# Patient Record
Sex: Female | Born: 1960 | Race: Black or African American | Hispanic: No | Marital: Married | Smoking: Former smoker
Health system: Southern US, Community
[De-identification: ages and names within clinical notes are randomized; demographics above are authoritative.]

## PROBLEM LIST (undated history)

## (undated) DIAGNOSIS — I10 Essential (primary) hypertension: Secondary | ICD-10-CM

## (undated) DIAGNOSIS — F32A Depression, unspecified: Secondary | ICD-10-CM

## (undated) DIAGNOSIS — E119 Type 2 diabetes mellitus without complications: Secondary | ICD-10-CM

## (undated) DIAGNOSIS — F329 Major depressive disorder, single episode, unspecified: Secondary | ICD-10-CM

## (undated) DIAGNOSIS — E785 Hyperlipidemia, unspecified: Secondary | ICD-10-CM

## (undated) DIAGNOSIS — D649 Anemia, unspecified: Secondary | ICD-10-CM

## (undated) DIAGNOSIS — H269 Unspecified cataract: Secondary | ICD-10-CM

## (undated) DIAGNOSIS — F419 Anxiety disorder, unspecified: Secondary | ICD-10-CM

## (undated) HISTORY — PX: TUBAL LIGATION: SHX77

## (undated) HISTORY — DX: Unspecified cataract: H26.9

## (undated) HISTORY — DX: Type 2 diabetes mellitus without complications: E11.9

## (undated) HISTORY — DX: Essential (primary) hypertension: I10

## (undated) HISTORY — DX: Anxiety disorder, unspecified: F41.9

## (undated) HISTORY — DX: Hyperlipidemia, unspecified: E78.5

## (undated) HISTORY — DX: Anemia, unspecified: D64.9

## (undated) HISTORY — DX: Depression, unspecified: F32.A

## (undated) HISTORY — PX: ABDOMINAL HYSTERECTOMY: SHX81

---
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
## 1982-07-16 HISTORY — PX: TUBAL LIGATION: SHX77

## 2008-07-16 HISTORY — PX: ABDOMINAL HYSTERECTOMY: SHX81

## 2013-07-16 HISTORY — PX: COLONOSCOPY: SHX174

## 2013-07-16 HISTORY — PX: POLYPECTOMY: SHX149

## 2013-08-12 LAB — HM COLONOSCOPY

## 2013-10-20 LAB — LIPID PANEL
Cholesterol: 279 mg/dL — AB (ref 0–200)
HDL: 73 mg/dL — AB (ref 35–70)
LDL Cholesterol: 185 mg/dL
TRIGLYCERIDES: 104 mg/dL (ref 40–160)

## 2013-10-20 LAB — HEMOGLOBIN A1C: Hgb A1c MFr Bld: 6.1 % — AB (ref 4.0–6.0)

## 2014-02-24 ENCOUNTER — Encounter: Payer: Self-pay | Admitting: Physician Assistant

## 2014-02-24 ENCOUNTER — Ambulatory Visit (INDEPENDENT_AMBULATORY_CARE_PROVIDER_SITE_OTHER): Payer: 59 | Admitting: Physician Assistant

## 2014-02-24 VITALS — BP 135/85 | HR 95 | Ht 62.75 in | Wt 180.0 lb

## 2014-02-24 DIAGNOSIS — E785 Hyperlipidemia, unspecified: Secondary | ICD-10-CM

## 2014-02-24 DIAGNOSIS — Z720 Tobacco use: Secondary | ICD-10-CM

## 2014-02-24 DIAGNOSIS — Z860100 Personal history of colon polyps, unspecified: Secondary | ICD-10-CM

## 2014-02-24 DIAGNOSIS — F17200 Nicotine dependence, unspecified, uncomplicated: Secondary | ICD-10-CM

## 2014-02-24 DIAGNOSIS — F17290 Nicotine dependence, other tobacco product, uncomplicated: Secondary | ICD-10-CM

## 2014-02-24 DIAGNOSIS — Z1239 Encounter for other screening for malignant neoplasm of breast: Secondary | ICD-10-CM

## 2014-02-24 MED ORDER — BUPROPION HCL ER (SMOKING DET) 150 MG PO TB12: 150.0000 mg | ORAL_TABLET | Freq: Two times a day (BID) | ORAL | Status: DC

## 2014-02-24 NOTE — Progress Notes (Addendum)
   Subjective:    Patient ID: Christina King, female    DOB: [birth year], 53 y.o.   MRN: [id number]  HPI Pt is a 53 yo female who presents to the clinic to establish care.   .. Past Medical History  Diagnosis Date  . Hyperlipidemia    She was started on zocor 3 months ago. She has lost 10lbs since then. She is working on low fat diet.   . Family History  Problem Relation Age of Onset  . Diabetes Mother   . Hypertension Mother   . Diabetes Father   . Hyperlipidemia Father   . Hypertension Father   . Diabetes Sister   . Depression Brother   . Cancer Paternal Aunt     esophageal   .. History   Social History  . Marital Status: Married    Spouse Name: N/A    Number of Children: N/A  . Years of Education: N/A   Occupational History  . Not on file.   Social History Main Topics  . Smoking status: Current Every Day Smoker  . Smokeless tobacco: Not on file  . Alcohol Use: Yes  . Drug Use: No  . Sexual Activity: No   Other Topics Concern  . Not on file   Social History Narrative  . No narrative on file    Pt would like to quit smoking. She has tried zyban and quit smoking in one month about 10 years ago. She stayed quit for 5 years but started back when her husband irritated her one night. She has a grand baby coming in march and she must quit.    Review of Systems  All other systems reviewed and are negative.      Objective:   Physical Exam  Constitutional: She is oriented to person, place, and time. She appears well-developed and well-nourished.  HENT:  Head: Normocephalic and atraumatic.  Neck: Normal range of motion. Neck supple.  Cardiovascular: Normal rate, regular rhythm and normal heart sounds.   Pulmonary/Chest: Effort normal and breath sounds normal.  Lymphadenopathy:    She has no cervical adenopathy.  Neurological: She is alert and oriented to person, place, and time.  Psychiatric: She has a normal mood and affect. Her behavior is normal.           Assessment & Plan:  Hyperlipidemia- follow up in 2-3 months for lipid recheck. Continue low fat diet in combination with zocor.   nictotine dependence/tobacco abuse- pt ready to quit. Tolerated zyban before. Started today with one refill. Tobacco cessation and cutting back discussed. Pt is very motivated. Follow up in 2 months. Warned of side effects. Call with any worsening anxiety or depression. Discussed staying on anti-depressant after stopping smoking to avoid relapse and help deal with anxiety. Pt unsure of what she would like to do.   Will order screening mammogram.  Continues to see GYN for annual exam.

## 2014-02-24 NOTE — Addendum Note (Signed)
Addended by: Donella Stade on: 02/24/2014 11:35 AM   Modules accepted: Orders

## 2014-02-25 ENCOUNTER — Other Ambulatory Visit: Payer: Self-pay | Admitting: *Deleted

## 2014-02-25 MED ORDER — BUPROPION HCL ER (SMOKING DET) 150 MG PO TB12: 150.0000 mg | ORAL_TABLET | Freq: Two times a day (BID) | ORAL | Status: DC

## 2014-03-16 ENCOUNTER — Encounter: Payer: Self-pay | Admitting: Physician Assistant

## 2014-03-16 DIAGNOSIS — E669 Obesity, unspecified: Secondary | ICD-10-CM | POA: Insufficient documentation

## 2014-03-16 DIAGNOSIS — F4322 Adjustment disorder with anxiety: Secondary | ICD-10-CM | POA: Insufficient documentation

## 2014-03-18 ENCOUNTER — Ambulatory Visit (INDEPENDENT_AMBULATORY_CARE_PROVIDER_SITE_OTHER): Payer: 59

## 2014-03-18 DIAGNOSIS — Z1231 Encounter for screening mammogram for malignant neoplasm of breast: Secondary | ICD-10-CM

## 2014-03-29 ENCOUNTER — Encounter: Payer: Self-pay | Admitting: Physician Assistant

## 2014-05-03 ENCOUNTER — Ambulatory Visit (INDEPENDENT_AMBULATORY_CARE_PROVIDER_SITE_OTHER): Payer: 59 | Admitting: Physician Assistant

## 2014-05-03 ENCOUNTER — Encounter: Payer: Self-pay | Admitting: Physician Assistant

## 2014-05-03 VITALS — BP 146/95 | HR 83 | Ht 62.75 in | Wt 182.0 lb

## 2014-05-03 DIAGNOSIS — F4322 Adjustment disorder with anxiety: Secondary | ICD-10-CM

## 2014-05-03 DIAGNOSIS — Z23 Encounter for immunization: Secondary | ICD-10-CM

## 2014-05-03 DIAGNOSIS — E785 Hyperlipidemia, unspecified: Secondary | ICD-10-CM

## 2014-05-03 DIAGNOSIS — Z131 Encounter for screening for diabetes mellitus: Secondary | ICD-10-CM

## 2014-05-03 DIAGNOSIS — R7301 Impaired fasting glucose: Secondary | ICD-10-CM

## 2014-05-03 DIAGNOSIS — Z720 Tobacco use: Secondary | ICD-10-CM

## 2014-05-03 LAB — COMPLETE METABOLIC PANEL WITH GFR
ALBUMIN: 4.7 g/dL (ref 3.5–5.2)
ALT: 47 U/L — AB (ref 0–35)
AST: 40 U/L — ABNORMAL HIGH (ref 0–37)
Alkaline Phosphatase: 109 U/L (ref 39–117)
BILIRUBIN TOTAL: 0.3 mg/dL (ref 0.2–1.2)
BUN: 10 mg/dL (ref 6–23)
CHLORIDE: 106 meq/L (ref 96–112)
CO2: 26 meq/L (ref 19–32)
Calcium: 10 mg/dL (ref 8.4–10.5)
Creat: 0.8 mg/dL (ref 0.50–1.10)
GFR, EST NON AFRICAN AMERICAN: 84 mL/min
GLUCOSE: 104 mg/dL — AB (ref 70–99)
POTASSIUM: 4.8 meq/L (ref 3.5–5.3)
Sodium: 141 mEq/L (ref 135–145)
TOTAL PROTEIN: 7.2 g/dL (ref 6.0–8.3)

## 2014-05-03 LAB — LIPID PANEL
Cholesterol: 240 mg/dL — ABNORMAL HIGH (ref 0–200)
HDL: 76 mg/dL (ref >39)
LDL CALC: 145 mg/dL — AB (ref 0–99)
Total CHOL/HDL Ratio: 3.2 Ratio
Triglycerides: 96 mg/dL (ref <150)
VLDL: 19 mg/dL (ref 0–40)

## 2014-05-03 MED ORDER — VARENICLINE TARTRATE 0.5 MG X 11 & 1 MG X 42 PO MISC: ORAL | Status: DC

## 2014-05-03 MED ORDER — BUPROPION HCL ER (XL) 150 MG PO TB24: 150.0000 mg | ORAL_TABLET | ORAL | Status: DC

## 2014-05-03 NOTE — Progress Notes (Signed)
   Subjective:    Patient ID: Christina King, female    DOB: [birth year], 53 y.o.   MRN: [id number]  HPI Pt presents to the clinic for 2 month follow up.   She has been on zyban to help her stop smoking. She has cut back but continues to smoke. She is at 8 cigs a day. Does feel much better on zyban with her mood and anxiety. Would like to stay on this as well as try something to help her quit smoking. She has a [place] coming and needs to stop smoking.   On zocor for hyperlipidemia. Not checked lipid since started. Tolerating zocor well.     Review of Systems  All other systems reviewed and are negative.      Objective:   Physical Exam  Constitutional: She is oriented to person, place, and time. She appears well-developed and well-nourished.  HENT:  Head: Normocephalic and atraumatic.  Cardiovascular: Normal rate, regular rhythm and normal heart sounds.   Pulmonary/Chest: Effort normal and breath sounds normal.  Neurological: She is alert and oriented to person, place, and time.  Skin: Skin is dry.  Psychiatric: She has a normal mood and affect. Her behavior is normal.          Assessment & Plan:  Tobacco abuse- stop zyban. Start chantix. Coupon card given. Discussed side effects and possible bad dreams. Stop medication if not able to tolerate. Set quit day 7 days after starting chantix. Follow up in one month.   Anxiety- pt felt better on zyban with her mood. She would like to stay on something like this. Given extended release wellbutrin 150mg  to try. Follow up to see if helping in 4 weeks.   Hyperlipidemia- will recheck lipid and cmp. Will refill zocor accordingly.   Tdap given without complication.

## 2014-05-06 NOTE — Addendum Note (Signed)
Addended by: Jamesetta So on: 05/06/2014 08:29 AM   Modules accepted: Orders

## 2014-05-10 ENCOUNTER — Encounter: Payer: Self-pay | Admitting: Physician Assistant

## 2014-05-11 ENCOUNTER — Other Ambulatory Visit: Payer: Self-pay | Admitting: Physician Assistant

## 2014-05-11 MED ORDER — SIMVASTATIN 40 MG PO TABS: 40.0000 mg | ORAL_TABLET | Freq: Every day | ORAL | Status: DC

## 2014-05-28 ENCOUNTER — Telehealth: Payer: Self-pay | Admitting: Physician Assistant

## 2014-05-31 ENCOUNTER — Ambulatory Visit: Payer: 59 | Admitting: Physician Assistant

## 2014-06-02 ENCOUNTER — Telehealth: Payer: Self-pay | Admitting: *Deleted

## 2014-06-02 DIAGNOSIS — R748 Abnormal levels of other serum enzymes: Secondary | ICD-10-CM

## 2014-06-02 NOTE — Telephone Encounter (Signed)
Labs ordered to recheck liver enzymes. 

## 2014-06-14 ENCOUNTER — Ambulatory Visit (INDEPENDENT_AMBULATORY_CARE_PROVIDER_SITE_OTHER): Payer: 59 | Admitting: Physician Assistant

## 2014-06-14 ENCOUNTER — Encounter: Payer: Self-pay | Admitting: Physician Assistant

## 2014-06-14 VITALS — BP 144/75 | HR 88 | Ht 62.75 in | Wt 182.0 lb

## 2014-06-14 DIAGNOSIS — Z720 Tobacco use: Secondary | ICD-10-CM

## 2014-06-14 MED ORDER — VARENICLINE TARTRATE 1 MG PO TABS: 1.0000 mg | ORAL_TABLET | Freq: Two times a day (BID) | ORAL | Status: DC

## 2014-06-15 NOTE — Progress Notes (Signed)
   Subjective:    Patient ID: Christina King, female    DOB: 23-Jul-1960, 53 y.o.   MRN: [id number]  HPI  Pt to the clinic to follow up on smoking cessation. One month of chantix down to 2 cigarettes a day from 1/2 pack a day. She is having some vivid dreams but they are not concerning at this point.     Review of Systems  All other systems reviewed and are negative.      Objective:   Physical Exam  Constitutional: She is oriented to person, place, and time. She appears well-developed and well-nourished.  HENT:  Head: Normocephalic and atraumatic.  Cardiovascular: Normal rate, regular rhythm and normal heart sounds.   Pulmonary/Chest: Effort normal and breath sounds normal. She has no wheezes.  Neurological: She is alert and oriented to person, place, and time.  Skin: Skin is dry.  Psychiatric: She has a normal mood and affect. Her behavior is normal.          Assessment & Plan:  Tobacco abuse/smoking cessation- refilled chantix for 2 months. Discussed in next 7 days to stop all smoking. Pt agrees. Follow up in 2 months. Call with any concerns.

## 2014-08-09 ENCOUNTER — Ambulatory Visit: Payer: 59 | Admitting: Physician Assistant

## 2014-09-24 NOTE — Telephone Encounter (Signed)
This encounter was opened by accident

## 2015-01-21 LAB — HM DIABETES EYE EXAM

## 2015-06-15 ENCOUNTER — Ambulatory Visit (INDEPENDENT_AMBULATORY_CARE_PROVIDER_SITE_OTHER): Payer: 59 | Admitting: Physician Assistant

## 2015-06-15 ENCOUNTER — Encounter: Payer: Self-pay | Admitting: Physician Assistant

## 2015-06-15 VITALS — BP 161/78 | HR 77 | Ht 62.75 in | Wt 216.0 lb

## 2015-06-15 DIAGNOSIS — E119 Type 2 diabetes mellitus without complications: Secondary | ICD-10-CM | POA: Diagnosis not present

## 2015-06-15 DIAGNOSIS — R631 Polydipsia: Secondary | ICD-10-CM | POA: Diagnosis not present

## 2015-06-15 DIAGNOSIS — G609 Hereditary and idiopathic neuropathy, unspecified: Secondary | ICD-10-CM | POA: Diagnosis not present

## 2015-06-15 DIAGNOSIS — H43393 Other vitreous opacities, bilateral: Secondary | ICD-10-CM | POA: Insufficient documentation

## 2015-06-15 LAB — POCT GLYCOSYLATED HEMOGLOBIN (HGB A1C): HEMOGLOBIN A1C: 6.5

## 2015-06-15 MED ORDER — METFORMIN HCL 500 MG PO TABS: 500.0000 mg | ORAL_TABLET | Freq: Two times a day (BID) | ORAL | Status: DC

## 2015-06-15 NOTE — Progress Notes (Signed)
   Subjective:    Patient ID: Christina King, female    DOB: 08-20-1960, 54 y.o.   MRN: [id number]  HPI  Patient is a 54 year old female who presents to the clinic with 2 months of numbness and tingling in both hands and both feet. She experiences the sensation constantly. Seems to be a little worse when she has been active or doing a lot. Nothing seems to help. She has a history of carpal tunnel but this is felt different. She is concerned because she also has had increased thirst and some floaters in her out. She has been to the ophthalmologist and they told her they were vitreous floaters. She denies any problem completing tasks with her hands or walking. She does not have diabetes at this time. She is having increased thirst. She is concerned that something bigger is going on here. She denies any neck or back pain.  Review of Systems  All other systems reviewed and are negative.      Objective:   Physical Exam  Constitutional: She is oriented to person, place, and time. She appears well-developed and well-nourished.  HENT:  Head: Normocephalic and atraumatic.  Cardiovascular: Normal rate, regular rhythm and normal heart sounds.   Pulmonary/Chest: Effort normal and breath sounds normal.  Musculoskeletal:  Strength of bilateral upper and lower extremities 5/5.  Hand grip bilaterally 5/5.  Pedal pulse 2+ and symmetric.  Good sensation in bilaterally feet.   Neurological: She is alert and oriented to person, place, and time. She has normal reflexes. No cranial nerve deficit. Coordination normal.  Skin: Skin is dry.  Psychiatric: She has a normal mood and affect. Her behavior is normal.          Assessment & Plan:  DM/excessive thirst- .[name] Lab Results  Component Value Date   HGBA1C 6.5 06/15/2015   Just makes the diagnosis of DM with a1c of 6.5.  Started metformin. Discussed side effects.  Encouraged to start diabetic diet. Gave HO.  Made nutrition appt.   Vitreous  floaters- reassured pt that she has been evaluated by opthlamology and no signs of diabetic eye changes. Floaters can be normal occurrence with age.   Peripheral neuropathy- unclear etiology. Will check for any metabolic causes such as [id number]. I do not think elevated glucose is bad enough to do this. Since numbness in every extremity back etiology is less likely. Will start with blood work and move from there.

## 2015-06-16 DIAGNOSIS — E559 Vitamin D deficiency, unspecified: Secondary | ICD-10-CM | POA: Insufficient documentation

## 2015-06-16 LAB — COMPLETE METABOLIC PANEL WITH GFR
ALT: 28 U/L (ref 6–29)
AST: 24 U/L (ref 10–35)
Albumin: 3.9 g/dL (ref 3.6–5.1)
Alkaline Phosphatase: 72 U/L (ref 33–130)
BUN: 12 mg/dL (ref 7–25)
CHLORIDE: 106 mmol/L (ref 98–110)
CO2: 24 mmol/L (ref 20–31)
Calcium: 9.4 mg/dL (ref 8.6–10.4)
Creat: 0.71 mg/dL (ref 0.50–1.05)
GFR, Est African American: >89 mL/min (ref >=60)
GFR, Est Non African American: >89 mL/min (ref >=60)
Glucose, Bld: 97 mg/dL (ref 65–99)
POTASSIUM: 3.8 mmol/L (ref 3.5–5.3)
Sodium: 141 mmol/L (ref 135–146)
Total Bilirubin: 0.4 mg/dL (ref 0.2–1.2)
Total Protein: 6.6 g/dL (ref 6.1–8.1)

## 2015-06-16 LAB — CBC WITH DIFFERENTIAL/PLATELET
BASOS ABS: 0 10*3/uL (ref 0.0–0.1)
Basophils Relative: 1 % (ref 0–1)
EOS PCT: 3 % (ref 0–5)
Eosinophils Absolute: 0.1 10*3/uL (ref 0.0–0.7)
HCT: 41.1 % (ref 36.0–46.0)
Hemoglobin: 13.6 g/dL (ref 12.0–15.0)
Lymphocytes Relative: 41 % (ref 12–46)
Lymphs Abs: 1.9 10*3/uL (ref 0.7–4.0)
MCH: 30.3 pg (ref 26.0–34.0)
MCHC: 33.1 g/dL (ref 30.0–36.0)
MCV: 91.5 fL (ref 78.0–100.0)
MPV: 10.2 fL (ref 8.6–12.4)
Monocytes Absolute: 0.5 10*3/uL (ref 0.1–1.0)
Monocytes Relative: 11 % (ref 3–12)
NEUTROS ABS: 2 10*3/uL (ref 1.7–7.7)
Neutrophils Relative %: 44 % (ref 43–77)
PLATELETS: 246 10*3/uL (ref 150–400)
RBC: 4.49 MIL/uL (ref 3.87–5.11)
RDW: 14.4 % (ref 11.5–15.5)
WBC: 4.6 10*3/uL (ref 4.0–10.5)

## 2015-06-16 LAB — VITAMIN D 25 HYDROXY (VIT D DEFICIENCY, FRACTURES): Vit D, 25-Hydroxy: 8 ng/mL — ABNORMAL LOW (ref 30–100)

## 2015-06-16 LAB — TSH: TSH: 1.117 u[IU]/mL (ref 0.350–4.500)

## 2015-06-16 LAB — SEDIMENTATION RATE: Sed Rate: 4 mm/hr (ref 0–30)

## 2015-06-16 LAB — FOLATE: Folate: 8.2 ng/mL

## 2015-06-16 LAB — VITAMIN B12: VITAMIN B 12: 700 pg/mL (ref 211–911)

## 2015-06-16 LAB — FERRITIN: Ferritin: 87 ng/mL (ref 10–291)

## 2015-06-16 MED ORDER — VITAMIN D (ERGOCALCIFEROL) 1.25 MG (50000 UNIT) PO CAPS: 50000.0000 [IU] | ORAL_CAPSULE | ORAL | Status: DC

## 2015-06-17 ENCOUNTER — Encounter: Payer: Self-pay | Admitting: Physician Assistant

## 2015-06-17 ENCOUNTER — Ambulatory Visit (INDEPENDENT_AMBULATORY_CARE_PROVIDER_SITE_OTHER): Payer: 59 | Admitting: Physician Assistant

## 2015-06-17 VITALS — BP 148/75 | HR 69 | Ht 62.75 in | Wt 214.0 lb

## 2015-06-17 DIAGNOSIS — Z114 Encounter for screening for human immunodeficiency virus [HIV]: Secondary | ICD-10-CM

## 2015-06-17 DIAGNOSIS — Z1159 Encounter for screening for other viral diseases: Secondary | ICD-10-CM

## 2015-06-17 DIAGNOSIS — E119 Type 2 diabetes mellitus without complications: Secondary | ICD-10-CM | POA: Insufficient documentation

## 2015-06-17 DIAGNOSIS — Z0000 Encounter for general adult medical examination without abnormal findings: Secondary | ICD-10-CM

## 2015-06-17 DIAGNOSIS — Z23 Encounter for immunization: Secondary | ICD-10-CM | POA: Diagnosis not present

## 2015-06-17 DIAGNOSIS — E785 Hyperlipidemia, unspecified: Secondary | ICD-10-CM

## 2015-06-17 DIAGNOSIS — G609 Hereditary and idiopathic neuropathy, unspecified: Secondary | ICD-10-CM | POA: Insufficient documentation

## 2015-06-17 DIAGNOSIS — L639 Alopecia areata, unspecified: Secondary | ICD-10-CM

## 2015-06-17 DIAGNOSIS — L659 Nonscarring hair loss, unspecified: Secondary | ICD-10-CM

## 2015-06-17 LAB — POCT UA - MICROALBUMIN
CREATININE, POC: 200 mg/dL
Microalbumin Ur, POC: 30 mg/L

## 2015-06-17 LAB — LIPID PANEL
CHOL/HDL RATIO: 3.2 ratio (ref <=5.0)
CHOLESTEROL: 239 mg/dL — AB (ref 125–200)
HDL: 75 mg/dL (ref >=46)
LDL Cholesterol: 145 mg/dL — ABNORMAL HIGH (ref <130)
TRIGLYCERIDES: 96 mg/dL (ref <150)
VLDL: 19 mg/dL (ref <30)

## 2015-06-17 MED ORDER — CLOBETASOL PROPIONATE 0.05 % EX CREA: 1.0000 "application " | TOPICAL_CREAM | Freq: Two times a day (BID) | CUTANEOUS | Status: DC

## 2015-06-17 MED ORDER — MINOXIDIL 5 % EX FOAM: CUTANEOUS | Status: DC

## 2015-06-17 NOTE — Progress Notes (Signed)
Subjective:    Patient ID: Christina King, female    DOB: 1961-06-28, 54 y.o.   MRN: [id number]  HPI   Review of Systems     Objective:   Physical Exam        Assessment & Plan:   Subjective:     Christina King is a 54 y.o. female and is here for a comprehensive physical exam. The patient reports problems - pt would like to address hair loss. seems to be coming out more at the top. no patches. tried nothing. .  Social History   Social History  . Marital Status: Married    Spouse Name: N/A  . Number of Children: N/A  . Years of Education: N/A   Occupational History  . Not on file.   Social History Main Topics  . Smoking status: Former Smoker    Types: Cigarettes    Quit date: 06/20/2014  . Smokeless tobacco: Not on file  . Alcohol Use: 0.0 oz/week    0 Standard drinks or equivalent per week  . Drug Use: No  . Sexual Activity: No   Other Topics Concern  . Not on file   Social History Narrative   Health Maintenance  Topic Date Due  . Hepatitis C Screening  11/11/1959  . PNEUMOCOCCAL POLYSACCHARIDE VACCINE (1) 12/06/1961  . FOOT EXAM  12/06/1969  . OPHTHALMOLOGY EXAM  12/06/1969  . URINE MICROALBUMIN  12/06/1969  . HIV Screening  12/07/1974  . INFLUENZA VACCINE  06/16/2016 (Originally 02/14/2015)  . PAP SMEAR  06/16/2045 (Originally 12/06/1980)  . HEMOGLOBIN A1C  12/13/2015  . MAMMOGRAM  03/18/2016  . COLONOSCOPY  07/16/2018  . TETANUS/TDAP  05/03/2024    The following portions of the patient's history were reviewed and updated as appropriate: allergies, current medications, past family history, past medical history, past social history, past surgical history and problem list.  Review of Systems Pertinent items noted in HPI and remainder of comprehensive ROS otherwise negative.   Objective:    BP 148/75 mmHg  Pulse 69  Ht 5' 2.75" (1.594 m)  Wt 214 lb (97.07 kg)  BMI 38.20 kg/m2 General appearance: alert, cooperative and appears stated  age Head: Normocephalic, without obvious abnormality, atraumatic Eyes: conjunctivae/corneas clear. PERRL, EOM's intact. Fundi benign. Ears: normal TM's and external ear canals both ears Nose: Nares normal. Septum midline. Mucosa normal. No drainage or sinus tenderness. Throat: lips, mucosa, and tongue normal; teeth and gums normal Neck: no adenopathy, no carotid bruit, no JVD, supple, symmetrical, trachea midline and thyroid not enlarged, symmetric, no tenderness/mass/nodules Back: symmetric, no curvature. ROM normal. No CVA tenderness. Lungs: clear to auscultation bilaterally Heart: regular rate and rhythm, S1, S2 normal, no murmur, click, rub or gallop Abdomen: soft, non-tender; bowel sounds normal; no masses,  no organomegaly Extremities: extremities normal, atraumatic, no cyanosis or edema Pulses: 2+ and symmetric Skin: Skin color, texture, turgor normal. No rashes or lesionsdiffuse hair loss around the top of head, no patches.  Lymph nodes: Cervical, supraclavicular, and axillary nodes normal. Neurologic: Grossly normal    Assessment:    Healthy female exam.      Plan:    CPE- fasting labs were ordered. Screening for hep C and HIV. Patient is up-to-date on her vaccines as of today. She declined the flu shot. Encouraged vitamin D 800 units and calcium 1500 mg a day. Encourage regular exercise and healthy diet. Mammogram up-to-date. Colonoscopy up-to-date.  DM- recent diagnosis of diabetes. normal micro. Pneumonia vaccine given today.  Normal foot exam. Patient reports recent eye exam will call to get.  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 Started clobetasol cream with minoxidil foam. Will refer to dermatology for further evaluation and treatment.  See After Visit Summary for Counseling Recommendations

## 2015-06-17 NOTE — Patient Instructions (Addendum)
Keeping You Healthy  Get These Tests  Blood Pressure- Have your blood pressure checked by your healthcare provider at least once a year.  Normal blood pressure is 120/80.  Weight- Have your body mass index (BMI) calculated to screen for obesity.  BMI is a measure of body fat based on height and weight.  You can calculate your own BMI at www.nhlbisupport.com/bmi/  Cholesterol- Have your cholesterol checked every year.  Diabetes- Have your blood sugar checked every year if you have high blood pressure, high cholesterol, a family history of diabetes or if you are overweight.  Pap Test - Have a pap test every 1 to 5 years if you have been sexually active.  If you are older than 65 and recent pap tests have been normal you may not need additional pap tests.  In addition, if you have had a hysterectomy  for benign disease additional pap tests are not necessary.  Mammogram-Yearly mammograms are essential for early detection of breast cancer  Screening for Colon Cancer- Colonoscopy starting at age 50. Screening may begin sooner depending on your family history and other health conditions.  Follow up colonoscopy as directed by your Gastroenterologist.  Screening for Osteoporosis- Screening begins at age 65 with bone density scanning, sooner if you are at higher risk for developing Osteoporosis.  Get these medicines  Calcium with Vitamin D- Your body requires 1200-1500 mg of Calcium a day and 800-1000 IU of Vitamin D a day.  You can only absorb 500 mg of Calcium at a time therefore Calcium must be taken in 2 or 3 separate doses throughout the day.  Hormones- Hormone therapy has been associated with increased risk for certain cancers and heart disease.  Talk to your healthcare provider about if you need relief from menopausal symptoms.  Aspirin- Ask your healthcare provider about taking Aspirin to prevent Heart Disease and Stroke.  Get these Immuniztions  Flu shot- Every fall  Pneumonia shot-  Once after the age of 65; if you are younger ask your healthcare provider if you need a pneumonia shot.  Tetanus- Every ten years.  Zostavax- Once after the age of 60 to prevent shingles.  Take these steps  Don't smoke- Your healthcare provider can help you quit. For tips on how to quit, ask your healthcare provider or go to www.smokefree.gov or call 1-800 QUIT-NOW.  Be physically active- Exercise 5 days a week for a minimum of 30 minutes.  If you are not already physically active, start slow and gradually work up to 30 minutes of moderate physical activity.  Try walking, dancing, bike riding, swimming, etc.  Eat a healthy diet- Eat a variety of healthy foods such as fruits, vegetables, whole grains, low fat milk, low fat cheeses, yogurt, lean meats, chicken, fish, eggs, dried beans, tofu, etc.  For more information go to www.thenutritionsource.org  Dental visit- Brush and floss teeth twice daily; visit your dentist twice a year.  Eye exam- Visit your Optometrist or Ophthalmologist yearly.  Drink alcohol in moderation- Limit alcohol intake to one drink or less a day.  Never drink and drive.  Depression- Your emotional health is as important as your physical health.  If you're feeling down or losing interest in things you normally enjoy, please talk to your healthcare provider.  Seat Belts- can save your life; always wear one  Smoke/Carbon Monoxide detectors- These detectors need to be installed on the appropriate level of your home.  Replace batteries at least once a year.  Violence- If   anyone is threatening or hurting you, please tell your healthcare provider.  Living Will/ Health care power of attorney- Discuss with your healthcare provider and family.  Alopecia Areata Alopecia areata is a type of hair loss. If you have this condition, you may lose hair on your scalp in patches. In some cases, you may lose all the hair on your scalp (alopecia totalis) or all the hair from your face  and body (alopecia universalis).  Alopecia areata is an autoimmune disease. This means your body's defense system (immune system) mistakes normal parts of your body for germs or other things that can make you sick. When you have alopecia areata, your immune system attacks your hair follicles.  Alopecia areata often starts during childhood but can occur at any age. Alopecia areata is not a danger to your health but can be stressful.  CAUSES  The cause of alopecia areata is unknown.  RISK FACTORS You may be at higher risk of alopecia areata if you:   Have a family history of alopecia.  Have a family history of another autoimmune disease, including type 1 diabetes and rheumatoid arthritis. SIGNS AND SYMPTOMS Signs of alopecia areata may include:  Loss of scalp hair in small, round patches. These may be about the size of a quarter.  Loss of all hair on your scalp.  Loss of eyebrow hair, facial hair, or the hair inside your nose (nasal hair).  Hair loss over your entire body. DIAGNOSIS  Alopecia areata may be diagnosed by:  Medical history and physical exam.  Taking a sample of hair to check under a microscope.  Taking a small piece of skin (biopsy) to examine under a microscope.  Blood tests to rule out other autoimmune diseases. TREATMENT  There is no cure for alopecia areata, but the disease often goes away over time. You will not lose the ability to regrow hair. Some medicines may help your hair regrow more quickly. These include:  Corticosteroids. These block inflammation caused by your immune system. You may get this medicine as a lotion for your skin or as an injection.  Minoxidil. This is a hair growth medicine you can use in areas of hair loss.  Anthralin. This is a medicine for a skin inflammation called psoriasis that may also help alopecia.  Diphencyprone. This medicine is applied to your skin and may stimulate hair growth. HOME CARE INSTRUCTIONS  Use sunscreen or  cover your head when outdoors.  Take medicines only as directed by your health care provider.  If you have lost your eyebrows, wear sunglasses outside to keep dust out of your eyes.  If you have lost hair inside your nose, wear a kerchief over your face or apply ointment to the inside of your nose. This keeps out dust and other irritants.  Keep all follow-up visits as directed by your health care provider. This is important. SEEK MEDICAL CARE IF:  Your symptoms change.  You have new symptoms.  You have a reaction to your medicines.  You are struggling emotionally.   This information is not intended to replace advice given to you by your health care provider. Make sure you discuss any questions you have with your health care provider.   Document Released: 02/04/2004 Document Revised: 07/23/2014 Document Reviewed: 09/21/2013 Elsevier Interactive Patient Education [employer].

## 2015-06-18 LAB — HEPATITIS C ANTIBODY: HCV AB: NEGATIVE

## 2015-06-20 ENCOUNTER — Telehealth: Payer: Self-pay | Admitting: Physician Assistant

## 2015-06-20 DIAGNOSIS — L639 Alopecia areata, unspecified: Secondary | ICD-10-CM | POA: Insufficient documentation

## 2015-06-20 DIAGNOSIS — E119 Type 2 diabetes mellitus without complications: Secondary | ICD-10-CM | POA: Insufficient documentation

## 2015-06-20 LAB — HIV ANTIBODY (ROUTINE TESTING W REFLEX): HIV: NONREACTIVE

## 2015-06-20 NOTE — Telephone Encounter (Signed)
Please call to get last eye exam at [facility].

## 2015-06-24 ENCOUNTER — Other Ambulatory Visit: Payer: Self-pay | Admitting: *Deleted

## 2015-06-24 MED ORDER — SIMVASTATIN 40 MG PO TABS: 40.0000 mg | ORAL_TABLET | Freq: Every day | ORAL | Status: DC

## 2015-06-28 NOTE — Telephone Encounter (Signed)
Done

## 2015-07-26 ENCOUNTER — Other Ambulatory Visit: Payer: Self-pay | Admitting: *Deleted

## 2015-07-26 MED ORDER — METFORMIN HCL 500 MG PO TABS: 500.0000 mg | ORAL_TABLET | Freq: Two times a day (BID) | ORAL | Status: DC

## 2015-07-26 MED ORDER — SIMVASTATIN 40 MG PO TABS: 40.0000 mg | ORAL_TABLET | Freq: Every day | ORAL | Status: DC

## 2015-07-26 MED ORDER — CLOBETASOL PROPIONATE 0.05 % EX CREA: 1.0000 "application " | TOPICAL_CREAM | Freq: Two times a day (BID) | CUTANEOUS | Status: DC

## 2015-08-28 ENCOUNTER — Other Ambulatory Visit: Payer: Self-pay | Admitting: Physician Assistant

## 2015-09-22 ENCOUNTER — Other Ambulatory Visit: Payer: Self-pay | Admitting: Physician Assistant

## 2015-09-22 ENCOUNTER — Other Ambulatory Visit: Payer: Self-pay | Admitting: *Deleted

## 2015-09-22 DIAGNOSIS — E559 Vitamin D deficiency, unspecified: Secondary | ICD-10-CM

## 2015-09-22 MED ORDER — METFORMIN HCL 500 MG PO TABS: 500.0000 mg | ORAL_TABLET | Freq: Two times a day (BID) | ORAL | Status: DC

## 2015-09-22 MED ORDER — SIMVASTATIN 40 MG PO TABS: 40.0000 mg | ORAL_TABLET | Freq: Every day | ORAL | Status: DC

## 2015-09-23 ENCOUNTER — Ambulatory Visit: Payer: 59 | Admitting: Physician Assistant

## 2015-09-24 LAB — VITAMIN D 25 HYDROXY (VIT D DEFICIENCY, FRACTURES): VIT D 25 HYDROXY: 36 ng/mL (ref 30–100)

## 2015-09-28 ENCOUNTER — Ambulatory Visit (INDEPENDENT_AMBULATORY_CARE_PROVIDER_SITE_OTHER): Payer: 59 | Admitting: Physician Assistant

## 2015-09-28 ENCOUNTER — Encounter: Payer: Self-pay | Admitting: Physician Assistant

## 2015-09-28 VITALS — BP 116/70 | HR 79 | Ht 62.75 in | Wt 211.0 lb

## 2015-09-28 DIAGNOSIS — E669 Obesity, unspecified: Secondary | ICD-10-CM | POA: Diagnosis not present

## 2015-09-28 DIAGNOSIS — E119 Type 2 diabetes mellitus without complications: Secondary | ICD-10-CM

## 2015-09-28 DIAGNOSIS — R635 Abnormal weight gain: Secondary | ICD-10-CM | POA: Diagnosis not present

## 2015-09-28 DIAGNOSIS — E559 Vitamin D deficiency, unspecified: Secondary | ICD-10-CM | POA: Diagnosis not present

## 2015-09-28 LAB — POCT GLYCOSYLATED HEMOGLOBIN (HGB A1C): Hemoglobin A1C: 6.3

## 2015-09-28 MED ORDER — METFORMIN HCL 500 MG PO TABS: 500.0000 mg | ORAL_TABLET | Freq: Two times a day (BID) | ORAL | Status: DC

## 2015-09-28 MED ORDER — PHENTERMINE HCL 37.5 MG PO TABS: 37.5000 mg | ORAL_TABLET | Freq: Every day | ORAL | Status: DC

## 2015-09-28 NOTE — Progress Notes (Signed)
   Subjective:    Patient ID: Christina King, female    DOB: 15-Jan-1961, 55 y.o.   MRN: [id number]  HPI Pt is a 55 yo female who presents to the clinic to follow up on diabetes. She is taking only metformin. She does not check her sugars. She denies any hypoglycemic events. She does have some ongoing peripheral neuropathy but has improved since starting vitamin D. She denies any open sores or wounds. She is trying to lose weight at this time. She does take her Zocor daily. She denies any side effects from medications. She had an eye exam last year in the summer.     Review of Systems  All other systems reviewed and are negative.      Objective:   Physical Exam  Constitutional: She is oriented to person, place, and time. She appears well-developed and well-nourished.  Obese  HENT:  Head: Normocephalic and atraumatic.  Cardiovascular: Normal rate, regular rhythm and normal heart sounds.   Pulmonary/Chest: Effort normal and breath sounds normal.  Neurological: She is alert and oriented to person, place, and time.  Skin: Skin is dry.  Psychiatric: She has a normal mood and affect. Her behavior is normal.          Assessment & Plan:  DM type II controlled-  A1C is 6.3 doing great.  Continue on metformin daily.  Discussed weight loss and diet changes. See below.  Will call and get eye exam from June 2016.   Obesity-discussed options. Would like to try phentermine. Discussed side affects. Encouraged patient to start with one half tablet daily and increase to 1 tablet after one to 2 weeks. Encouraged patient to keep to a 1500-calorie diet as well as exercising at least 30 minutes at least 3 times a week. Follow-up in one month with nurse visit   Hyperlipidemia- on zocor will check lipid in 3 months to make sure zocor is being effective.

## 2015-09-28 NOTE — Patient Instructions (Signed)
D3 2000 units daily.

## 2015-10-15 ENCOUNTER — Other Ambulatory Visit: Payer: Self-pay | Admitting: Physician Assistant

## 2015-10-26 ENCOUNTER — Ambulatory Visit: Payer: 59

## 2015-10-26 ENCOUNTER — Ambulatory Visit (INDEPENDENT_AMBULATORY_CARE_PROVIDER_SITE_OTHER): Payer: 59 | Admitting: Physician Assistant

## 2015-10-26 VITALS — BP 138/79 | HR 67 | Wt 205.0 lb

## 2015-10-26 DIAGNOSIS — R635 Abnormal weight gain: Secondary | ICD-10-CM

## 2015-10-26 MED ORDER — PHENTERMINE HCL 37.5 MG PO TABS: 37.5000 mg | ORAL_TABLET | Freq: Every day | ORAL | Status: DC

## 2015-10-26 NOTE — Progress Notes (Signed)
Patient is here for blood pressure and weight check. Denies any trouble sleeping, palpitations, or any other medication problems. Patient has lost weight. A refill for Phentermine will be sent to patient preferred pharmacy. Patient advised to schedule a four week nurse visit and keep her upcoming appointment with her PCP. Verbalized understanding, no further questions. 

## 2015-10-27 ENCOUNTER — Encounter: Payer: Self-pay | Admitting: Physician Assistant

## 2015-11-25 ENCOUNTER — Ambulatory Visit (INDEPENDENT_AMBULATORY_CARE_PROVIDER_SITE_OTHER): Payer: 59 | Admitting: Sports Medicine

## 2015-11-25 VITALS — BP 134/83 | HR 88 | Ht 62.0 in | Wt 202.0 lb

## 2015-11-25 DIAGNOSIS — E669 Obesity, unspecified: Secondary | ICD-10-CM | POA: Diagnosis not present

## 2015-11-25 MED ORDER — PHENTERMINE HCL 37.5 MG PO TABS: 37.5000 mg | ORAL_TABLET | Freq: Every day | ORAL | Status: DC

## 2015-11-25 NOTE — Progress Notes (Signed)
Patient was in office for weight and blood pressure  check. Patient did not have any complaints to having side effects. Rhonda Cunningham,CMA

## 2015-12-23 ENCOUNTER — Ambulatory Visit (INDEPENDENT_AMBULATORY_CARE_PROVIDER_SITE_OTHER): Payer: 59 | Admitting: Physician Assistant

## 2015-12-23 VITALS — BP 132/84 | HR 101 | Wt 202.0 lb

## 2015-12-23 DIAGNOSIS — R635 Abnormal weight gain: Secondary | ICD-10-CM

## 2015-12-23 MED ORDER — PHENTERMINE HCL 37.5 MG PO TABS: 37.5000 mg | ORAL_TABLET | Freq: Every day | ORAL | Status: DC

## 2015-12-23 NOTE — Progress Notes (Signed)
Pt.notified

## 2015-12-23 NOTE — Progress Notes (Signed)
Christina King presents to clinic for weight check. Pt weight stayed the same at 202 lb.  She stated that the medication don't have the same affect from when she first started the medication. She is willing to try other weight loss medications if phentermine will not help her on this weight loss journey. -[facility]/RMA

## 2015-12-28 ENCOUNTER — Ambulatory Visit: Payer: 59 | Admitting: Physician Assistant

## 2015-12-30 ENCOUNTER — Ambulatory Visit (INDEPENDENT_AMBULATORY_CARE_PROVIDER_SITE_OTHER): Payer: 59 | Admitting: Physician Assistant

## 2015-12-30 ENCOUNTER — Encounter: Payer: Self-pay | Admitting: Physician Assistant

## 2015-12-30 VITALS — BP 140/75 | HR 101 | Ht 62.0 in | Wt 201.0 lb

## 2015-12-30 DIAGNOSIS — F329 Major depressive disorder, single episode, unspecified: Secondary | ICD-10-CM

## 2015-12-30 DIAGNOSIS — F32A Depression, unspecified: Secondary | ICD-10-CM

## 2015-12-30 DIAGNOSIS — E119 Type 2 diabetes mellitus without complications: Secondary | ICD-10-CM

## 2015-12-30 LAB — POCT GLYCOSYLATED HEMOGLOBIN (HGB A1C): Hemoglobin A1C: 6.2

## 2015-12-30 MED ORDER — BUPROPION HCL ER (XL) 150 MG PO TB24: 150.0000 mg | ORAL_TABLET | Freq: Every day | ORAL | Status: DC

## 2015-12-30 MED ORDER — LORCASERIN HCL 10 MG PO TABS: 1.0000 | ORAL_TABLET | Freq: Two times a day (BID) | ORAL | Status: DC

## 2015-12-30 NOTE — Progress Notes (Signed)
   Subjective:    Patient ID: Christina King, female    DOB: 1960/07/28, 55 y.o.   MRN: [id number]  HPI  Patient is a 55 year old female who presents to the visit for her 3 month follow-up on diabetes. She is not checking her sugars. She takes metformin daily. She denies any hypoglycemia. She has no open sores or wounds.  Patient seems more down than she usually is. I asked about her mood. She erupted and crying. She doesn't want to talk about how demanding her job is. She feels like she listens to people complain all day. She then goes home and tries to touch her husband but he shuts her down makes it feel like it's her fall and more about him. Her father-in-law lives with them and she states he is nasty and allowed. He also complains of the time. She just feels like she is having a breakdown. She does not have any history of depression or anxiety. She has taken phentermine for weight loss. She feels like that was a trigger as well. She stopped losing weight and she was doing so good. She feels like she is a failure.  Review of Systems  All other systems reviewed and are negative.      Objective:   Physical Exam  Constitutional: She is oriented to person, place, and time. She appears well-developed and well-nourished.  HENT:  Head: Normocephalic and atraumatic.  Cardiovascular: Normal rate, regular rhythm and normal heart sounds.   Pulmonary/Chest: Effort normal and breath sounds normal.  Neurological: She is alert and oriented to person, place, and time.  Psychiatric: Her behavior is normal.  Uncontrollably crying.           Assessment & Plan:  DM, type II- .[name] Results for orders placed or performed in visit on 12/30/15  POCT HgB A1C  Result Value Ref Range   Hemoglobin A1C 6.2    A!C 6.2 looks great.  Up to date on vaccines, microalbumin and eye exam.  Continue metformin.   Acute depression- Stop phentermine to see if making mood worse. started wellbutrin. Discussed side  effects. Follow up in 4-6 weeks. Discussed with patient that she needs to sit down with someone who she feels will listen and perhaps make some decisions that would help her feel more happy at home. Will make referral.   Obesity- stop phentermine. Consider belviq since weight loss is very important to you right now. Discussed SE's. Follow up in 4-6 weeks.

## 2016-01-02 DIAGNOSIS — F331 Major depressive disorder, recurrent, moderate: Secondary | ICD-10-CM | POA: Insufficient documentation

## 2016-02-10 ENCOUNTER — Ambulatory Visit (INDEPENDENT_AMBULATORY_CARE_PROVIDER_SITE_OTHER): Payer: 59 | Admitting: Physician Assistant

## 2016-02-10 ENCOUNTER — Encounter: Payer: Self-pay | Admitting: Physician Assistant

## 2016-02-10 VITALS — BP 141/78 | HR 66 | Ht 62.0 in | Wt 197.0 lb

## 2016-02-10 DIAGNOSIS — E669 Obesity, unspecified: Secondary | ICD-10-CM

## 2016-02-10 DIAGNOSIS — F329 Major depressive disorder, single episode, unspecified: Secondary | ICD-10-CM | POA: Diagnosis not present

## 2016-02-10 DIAGNOSIS — F32A Depression, unspecified: Secondary | ICD-10-CM

## 2016-02-10 MED ORDER — BUPROPION HCL ER (XL) 300 MG PO TB24: 300.0000 mg | ORAL_TABLET | Freq: Every day | ORAL | 0 refills | Status: DC

## 2016-02-10 MED ORDER — CLOBETASOL PROPIONATE 0.05 % EX CREA: 1.0000 "application " | TOPICAL_CREAM | Freq: Two times a day (BID) | CUTANEOUS | 2 refills | Status: DC

## 2016-02-10 NOTE — Progress Notes (Signed)
   Subjective:    Patient ID: Christina King, female    DOB: 09-03-1960, 55 y.o.   MRN: [id number]  HPI Patient is a 55 year old female who presents to the clinic to follow-up on depression. She does feel like her symptoms have improved. She does report that her situation has not improved at all at home. She still feels like there is no one to talk to or understand her home. She has a lot of trouble with her father-in-law living in the house. She feels like she is taking care of everyone. She was called for counseling but has not scheduled yet. She does feel like she is crying less and upbeat and able to handle the stress. She denies any suicidal or homicidal thoughts. She has been walking a little more but denies any other exercise. She has been watching what she eats and counting her calories. She has lost 4 pounds since June visit.   Review of Systems See history of present illness    Objective:   Physical Exam  Constitutional: She appears well-developed and well-nourished.  HENT:  Head: Normocephalic and atraumatic.  Cardiovascular: Normal rate, regular rhythm and normal heart sounds.   Pulmonary/Chest: Effort normal and breath sounds normal.  Skin: Skin is warm and dry.          Assessment & Plan:  Depression- PHQ-9 was 7. GAD-7 was 8. Increased Wellbutrin to 300 mg daily. Will follow-up in 2 months. Encouraged patient to schedule counseling appointment.   Obesity- continue on file the. Increase in Wellbutrin should also help her appetite. Encouraged exercise daily for mood and weight loss.

## 2016-03-01 ENCOUNTER — Encounter: Payer: Self-pay | Admitting: Physician Assistant

## 2016-03-01 ENCOUNTER — Ambulatory Visit (INDEPENDENT_AMBULATORY_CARE_PROVIDER_SITE_OTHER): Payer: PRIVATE HEALTH INSURANCE | Admitting: Licensed Clinical Social Worker

## 2016-03-01 DIAGNOSIS — F321 Major depressive disorder, single episode, moderate: Secondary | ICD-10-CM

## 2016-03-01 LAB — HM DIABETES EYE EXAM

## 2016-03-02 ENCOUNTER — Other Ambulatory Visit: Payer: Self-pay | Admitting: Physician Assistant

## 2016-03-07 ENCOUNTER — Other Ambulatory Visit: Payer: Self-pay | Admitting: Physician Assistant

## 2016-03-07 ENCOUNTER — Encounter: Payer: Self-pay | Admitting: Physician Assistant

## 2016-03-07 DIAGNOSIS — H43812 Vitreous degeneration, left eye: Secondary | ICD-10-CM | POA: Insufficient documentation

## 2016-03-07 DIAGNOSIS — H524 Presbyopia: Secondary | ICD-10-CM | POA: Insufficient documentation

## 2016-03-07 DIAGNOSIS — H5211 Myopia, right eye: Secondary | ICD-10-CM | POA: Insufficient documentation

## 2016-03-07 DIAGNOSIS — H52222 Regular astigmatism, left eye: Secondary | ICD-10-CM | POA: Insufficient documentation

## 2016-03-13 ENCOUNTER — Other Ambulatory Visit: Payer: Self-pay | Admitting: *Deleted

## 2016-03-13 MED ORDER — SIMVASTATIN 40 MG PO TABS: 40.0000 mg | ORAL_TABLET | Freq: Every day | ORAL | 1 refills | Status: DC

## 2016-03-15 ENCOUNTER — Ambulatory Visit: Payer: 59 | Admitting: Licensed Clinical Social Worker

## 2016-03-22 ENCOUNTER — Ambulatory Visit: Payer: 59 | Admitting: Licensed Clinical Social Worker

## 2016-04-13 ENCOUNTER — Ambulatory Visit: Payer: 59 | Admitting: Physician Assistant

## 2016-04-16 ENCOUNTER — Encounter: Payer: Self-pay | Admitting: Physician Assistant

## 2016-04-16 ENCOUNTER — Ambulatory Visit (INDEPENDENT_AMBULATORY_CARE_PROVIDER_SITE_OTHER): Payer: 59 | Admitting: Physician Assistant

## 2016-04-16 VITALS — BP 143/73 | HR 77 | Ht 62.0 in | Wt 197.0 lb

## 2016-04-16 DIAGNOSIS — F4322 Adjustment disorder with anxiety: Secondary | ICD-10-CM

## 2016-04-16 DIAGNOSIS — E669 Obesity, unspecified: Secondary | ICD-10-CM

## 2016-04-16 DIAGNOSIS — F331 Major depressive disorder, recurrent, moderate: Secondary | ICD-10-CM | POA: Diagnosis not present

## 2016-04-16 MED ORDER — BUPROPION HCL ER (XL) 300 MG PO TB24: 300.0000 mg | ORAL_TABLET | Freq: Every day | ORAL | 1 refills | Status: DC

## 2016-04-16 MED ORDER — LORCASERIN HCL 10 MG PO TABS: 1.0000 | ORAL_TABLET | Freq: Two times a day (BID) | ORAL | 0 refills | Status: DC

## 2016-04-16 NOTE — Progress Notes (Signed)
   Subjective:    Patient ID: Roetta Sessions, female    DOB: [birth year], 55 y.o.   MRN: [id number]  HPI  Pt presents to the clinic to follow up on mood and weight. She is on wellbutrin and belviq. She has lost 17lbs and feels better. She is trying to take more time for herself. She still has a hard work and home life but learning to cope. She continues to go to counseling with [facility] and she feels like this is helping a lot.    Review of Systems  All other systems reviewed and are negative.      Objective:   Physical Exam  Constitutional: She is oriented to person, place, and time. She appears well-developed and well-nourished.  HENT:  Head: Normocephalic and atraumatic.  Cardiovascular: Normal rate, regular rhythm and normal heart sounds.   Pulmonary/Chest: Effort normal and breath sounds normal.  Neurological: She is alert and oriented to person, place, and time.  Psychiatric: She has a normal mood and affect. Her behavior is normal.          Assessment & Plan:  MDD/adjustment disorder with anxiety- PHQ-9 was 8. GAD-7 was 6. Continue on wellbutrin. Continue with counseling. Discussed with patient we can add other medications to help with depression symptoms but pt declined today.   Obesity- refilled belviq. Continued to discussed healthy diet and continue to exercise and walk daily. Follow up in 3 months.

## 2016-04-17 DIAGNOSIS — E669 Obesity, unspecified: Secondary | ICD-10-CM | POA: Insufficient documentation

## 2016-04-19 ENCOUNTER — Ambulatory Visit (INDEPENDENT_AMBULATORY_CARE_PROVIDER_SITE_OTHER): Payer: PRIVATE HEALTH INSURANCE | Admitting: Licensed Clinical Social Worker

## 2016-04-19 DIAGNOSIS — F321 Major depressive disorder, single episode, moderate: Secondary | ICD-10-CM

## 2016-05-23 ENCOUNTER — Other Ambulatory Visit: Payer: Self-pay | Admitting: *Deleted

## 2016-05-23 ENCOUNTER — Telehealth: Payer: Self-pay | Admitting: Physician Assistant

## 2016-05-23 MED ORDER — LORCASERIN HCL 10 MG PO TABS: 1.0000 | ORAL_TABLET | Freq: Two times a day (BID) | ORAL | 0 refills | Status: DC

## 2016-05-23 NOTE — Telephone Encounter (Signed)
Patient called adv that she faxed FMLA paper work last week and said that it needs to be sent in by Nov 12th which is a Sunday and she will be going out of town Nov 10th if it could possibly be sent by fax to her manager Attn: Burnard Hawthorne [phone]. Thanks

## 2016-05-25 NOTE — Telephone Encounter (Signed)
I will send out today via fax. Will save copy for her to pick up as well.

## 2016-06-14 ENCOUNTER — Ambulatory Visit (INDEPENDENT_AMBULATORY_CARE_PROVIDER_SITE_OTHER): Payer: PRIVATE HEALTH INSURANCE | Admitting: Licensed Clinical Social Worker

## 2016-06-14 DIAGNOSIS — F321 Major depressive disorder, single episode, moderate: Secondary | ICD-10-CM | POA: Diagnosis not present

## 2016-07-20 ENCOUNTER — Ambulatory Visit: Payer: Self-pay | Admitting: Physician Assistant

## 2016-07-24 ENCOUNTER — Other Ambulatory Visit: Payer: Self-pay | Admitting: Physician Assistant

## 2016-08-09 ENCOUNTER — Ambulatory Visit (INDEPENDENT_AMBULATORY_CARE_PROVIDER_SITE_OTHER): Payer: PRIVATE HEALTH INSURANCE | Admitting: Licensed Clinical Social Worker

## 2016-08-09 DIAGNOSIS — F324 Major depressive disorder, single episode, in partial remission: Secondary | ICD-10-CM

## 2016-10-25 ENCOUNTER — Ambulatory Visit (INDEPENDENT_AMBULATORY_CARE_PROVIDER_SITE_OTHER): Payer: PRIVATE HEALTH INSURANCE | Admitting: Licensed Clinical Social Worker

## 2016-10-25 DIAGNOSIS — F324 Major depressive disorder, single episode, in partial remission: Secondary | ICD-10-CM

## 2016-12-19 ENCOUNTER — Ambulatory Visit: Payer: PRIVATE HEALTH INSURANCE | Admitting: Licensed Clinical Social Worker

## 2017-12-18 ENCOUNTER — Encounter: Payer: Self-pay | Admitting: Physician Assistant

## 2017-12-18 ENCOUNTER — Ambulatory Visit (INDEPENDENT_AMBULATORY_CARE_PROVIDER_SITE_OTHER): Payer: 59 | Admitting: Physician Assistant

## 2017-12-18 VITALS — BP 138/82 | HR 73 | Temp 98.2°F | Wt 231.3 lb

## 2017-12-18 DIAGNOSIS — Z1231 Encounter for screening mammogram for malignant neoplasm of breast: Secondary | ICD-10-CM

## 2017-12-18 DIAGNOSIS — F331 Major depressive disorder, recurrent, moderate: Secondary | ICD-10-CM | POA: Diagnosis not present

## 2017-12-18 DIAGNOSIS — Z0000 Encounter for general adult medical examination without abnormal findings: Secondary | ICD-10-CM

## 2017-12-18 DIAGNOSIS — E782 Mixed hyperlipidemia: Secondary | ICD-10-CM

## 2017-12-18 DIAGNOSIS — E119 Type 2 diabetes mellitus without complications: Secondary | ICD-10-CM | POA: Diagnosis not present

## 2017-12-18 NOTE — Progress Notes (Signed)
Subjective:     Christina King is a 57 y.o. female and is here for a comprehensive physical exam. The patient reports problems - see below. .  Not sure when last pap was but never had an abnormal one.   Stopped all medication.   She is not going to counseling for depression.     Social History   Socioeconomic History  . Marital status: Married    Spouse name: Not on file  . Number of children: Not on file  . Years of education: Not on file  . Highest education level: Not on file  Occupational History  . Not on file  Social Needs  . Financial resource strain: Not on file  . Food insecurity:    Worry: Not on file    Inability: Not on file  . Transportation needs:    Medical: Not on file    Non-medical: Not on file  Tobacco Use  . Smoking status: Former Smoker    Types: Cigarettes    Last attempt to quit: 06/20/2014    Years since quitting: 3.4  . Smokeless tobacco: Never Used  Substance and Sexual Activity  . Alcohol use: Yes    Alcohol/week: 0.0 oz  . Drug use: No  . Sexual activity: Never  Lifestyle  . Physical activity:    Days per week: Not on file    Minutes per session: Not on file  . Stress: Not on file  Relationships  . Social connections:    Talks on phone: Not on file    Gets together: Not on file    Attends religious service: Not on file    Active member of club or organization: Not on file    Attends meetings of clubs or organizations: Not on file    Relationship status: Not on file  . Intimate partner violence:    Fear of current or ex partner: Not on file    Emotionally abused: Not on file    Physically abused: Not on file    Forced sexual activity: Not on file  Other Topics Concern  . Not on file  Social History Narrative  . Not on file   Health Maintenance  Topic Date Due  . MAMMOGRAM  03/18/2016  . FOOT EXAM  06/16/2016  . URINE MICROALBUMIN  06/16/2016  . HEMOGLOBIN A1C  06/30/2016  . OPHTHALMOLOGY EXAM  03/01/2017  . PAP SMEAR   06/16/2045 (Originally 12/06/1980)  . INFLUENZA VACCINE  02/13/2018  . COLONOSCOPY  07/16/2018  . PNEUMOCOCCAL POLYSACCHARIDE VACCINE (2) 06/16/2020  . TETANUS/TDAP  05/03/2024  . Hepatitis C Screening  Completed  . HIV Screening  Completed    The following portions of the patient's history were reviewed and updated as appropriate: allergies, current medications, past family history, past medical history, past social history, past surgical history and problem list.  Review of Systems Pertinent items noted in HPI and remainder of comprehensive ROS otherwise negative.   Objective:    BP 138/82   Pulse 73   Temp 98.2 F (36.8 C) (Oral)   Wt 231 lb 4.8 oz (104.9 kg)   BMI 42.31 kg/m  General appearance: alert, cooperative, appears stated age and morbidly obese Head: Normocephalic, without obvious abnormality, atraumatic Eyes: conjunctivae/corneas clear. PERRL, EOM's intact. Fundi benign. Ears: normal TM's and external ear canals both ears Nose: Nares normal. Septum midline. Mucosa normal. No drainage or sinus tenderness. Throat: lips, mucosa, and tongue normal; teeth and gums normal Neck: no adenopathy, no  carotid bruit, no JVD, supple, symmetrical, trachea midline and thyroid not enlarged, symmetric, no tenderness/mass/nodules Back: symmetric, no curvature. ROM normal. No CVA tenderness. Lungs: clear to auscultation bilaterally Heart: regular rate and rhythm, S1, S2 normal, no murmur, click, rub or gallop Abdomen: soft, non-tender; bowel sounds normal; no masses,  no organomegaly Extremities: extremities normal, atraumatic, no cyanosis or edema Pulses: 2+ and symmetric Skin: Skin color, texture, turgor normal. No rashes or lesions Lymph nodes: Cervical, supraclavicular, and axillary nodes normal.    Assessment:    Healthy female exam.      Plan:     Marland KitchenMarland KitchenAntroinette was seen today for annual exam.  Diagnoses and all orders for this visit:  Routine physical examination -      Lipid Panel w/reflex Direct LDL -     COMPLETE METABOLIC PANEL WITH GFR -     TSH -     Hemoglobin A1c -     Vitamin D 1,25 dihydroxy -     B12  Visit for screening mammogram -     MM 3D SCREEN BREAST BILATERAL  Type 2 diabetes mellitus without complication, without long-term current use of insulin (HCC) -     COMPLETE METABOLIC PANEL WITH GFR -     Hemoglobin A1c  MDD (major depressive disorder), recurrent episode, moderate (HCC)  Morbid obesity (HCC) -     TSH  Mixed hyperlipidemia -     Lipid Panel w/reflex Direct LDL     .[name] Depression screen PHQ 2/9 12/18/2017  Decreased Interest 0  Down, Depressed, Hopeless 0  PHQ - 2 Score 0  Altered sleeping 1  Tired, decreased energy 0  Change in appetite 1  Feeling bad or failure about yourself  0  Trouble concentrating 0  Moving slowly or fidgety/restless 0  Suicidal thoughts 0  PHQ-9 Score 2  Difficult doing work/chores Not difficult at all   .[name] GAD 7 : Generalized Anxiety Score 12/18/2017  Nervous, Anxious, on Edge 0  Control/stop worrying 0  Worry too much - different things 0  Trouble relaxing 1  Restless 0  Easily annoyed or irritable 1  Afraid - awful might happen 0  Total GAD 7 Score 2  Anxiety Difficulty Not difficult at all   .[name] Discussed 150 minutes of exercise a week.  Encouraged vitamin D 1000 units and Calcium 1300mg  or 4 servings of dairy a day.  Mammogram ordered.  Colonoscopy up to date.  Placed on shingrix waiting list.  Fasting labs ordered.   Pt aware that likely she will need to start back on medication. We will better know her severity once we get labs.    [name].Discussed low carb diet with 1500 calories and 80g of protein.  Exercising at least 150 minutes a week.  [employer] could be a Microbiologist.  Listed medications to consider. Come in for follow up.  See After Visit Summary for Counseling Recommendations

## 2017-12-18 NOTE — Patient Instructions (Addendum)
belviq saxenda contrave qsymia  All weight loss medications.   NEEDS SHINGRIX.   Keeping You Healthy  Get These Tests  Blood Pressure- Have your blood pressure checked by your healthcare provider at least once a year.  Normal blood pressure is 120/80.  Weight- Have your body mass index (BMI) calculated to screen for obesity.  BMI is a measure of body fat based on height and weight.  You can calculate your own BMI at [employer].it  Cholesterol- Have your cholesterol checked every year.  Diabetes- Have your blood sugar checked every year if you have high blood pressure, high cholesterol, a family history of diabetes or if you are overweight.  Pap Test - Have a pap test every 1 to 5 years if you have been sexually active.  If you are older than 65 and recent pap tests have been normal you may not need additional pap tests.  In addition, if you have had a hysterectomy  for benign disease additional pap tests are not necessary.  Mammogram-Yearly mammograms are essential for early detection of breast cancer  Screening for Colon Cancer- Colonoscopy starting at age 30. Screening may begin sooner depending on your family history and other health conditions.  Follow up colonoscopy as directed by your Gastroenterologist.  Screening for Osteoporosis- Screening begins at age 27 with bone density scanning, sooner if you are at higher risk for developing Osteoporosis.  Get these medicines  Calcium with Vitamin D- Your body requires 1200-1500 mg of Calcium a day and [phone] IU of Vitamin D a day.  You can only absorb 500 mg of Calcium at a time therefore Calcium must be taken in 2 or 3 separate doses throughout the day.  Hormones- Hormone therapy has been associated with increased risk for certain cancers and heart disease.  Talk to your healthcare provider about if you need relief from menopausal symptoms.  Aspirin- Ask your healthcare provider about taking Aspirin to prevent Heart  Disease and Stroke.  Get these Immuniztions  Flu shot- Every fall  Pneumonia shot- Once after the age of 41; if you are younger ask your healthcare provider if you need a pneumonia shot.  Tetanus- Every ten years.  Zostavax- Once after the age of 71 to prevent shingles.  Take these steps  Don't smoke- Your healthcare provider can help you quit. For tips on how to quit, ask your healthcare provider or go to www.smokefree.gov or call 1-800 QUIT-NOW.  Be physically active- Exercise 5 days a week for a minimum of 30 minutes.  If you are not already physically active, start slow and gradually work up to 30 minutes of moderate physical activity.  Try walking, dancing, bike riding, swimming, etc.  Eat a healthy diet- Eat a variety of healthy foods such as fruits, vegetables, whole grains, low fat milk, low fat cheeses, yogurt, lean meats, chicken, fish, eggs, dried beans, tofu, etc.  For more information go to www.thenutritionsource.org  Dental visit- Brush and floss teeth twice daily; visit your dentist twice a year.  Eye exam- Visit your Optometrist or Ophthalmologist yearly.  Drink alcohol in moderation- Limit alcohol intake to one drink or less a day.  Never drink and drive.  Depression- Your emotional health is as important as your physical health.  If you're feeling down or losing interest in things you normally enjoy, please talk to your healthcare provider.  Seat Belts- can save your life; always wear one  Smoke/Carbon Monoxide detectors- These detectors need to be installed on the appropriate  level of your home.  Replace batteries at least once a year.  Violence- If anyone is threatening or hurting you, please tell your healthcare provider.  Living Will/ Health care power of attorney- Discuss with your healthcare provider and family.

## 2017-12-19 ENCOUNTER — Encounter: Payer: Self-pay | Admitting: Physician Assistant

## 2017-12-19 NOTE — Progress Notes (Signed)
Call pt: cholesterol does not look good at all. LDL 200 plus. HDL is good though. We need to restart statin. Are you ok with me sending over?  A!C is 6.6. Diabetic but not teribbily out of control. Would you be interested in starting a combination pill with metformin and a medication that causes you to urinate off some sugar daily. It also has cardioprotectant properties. Called synjardy? Recheck A!C in 3 months.  b12 looks good.  Vitamin D pending.

## 2017-12-20 ENCOUNTER — Other Ambulatory Visit: Payer: Self-pay | Admitting: Physician Assistant

## 2017-12-20 LAB — COMPLETE METABOLIC PANEL WITH GFR
AG RATIO: 1.5 (calc) (ref 1.0–2.5)
ALT: 27 U/L (ref 6–29)
AST: 21 U/L (ref 10–35)
Albumin: 4.3 g/dL (ref 3.6–5.1)
Alkaline phosphatase (APISO): 72 U/L (ref 33–130)
BUN: 11 mg/dL (ref 7–25)
CALCIUM: 9.6 mg/dL (ref 8.6–10.4)
CHLORIDE: 105 mmol/L (ref 98–110)
CO2: 26 mmol/L (ref 20–32)
Creat: 0.83 mg/dL (ref 0.50–1.05)
GFR, EST AFRICAN AMERICAN: 90 mL/min/{1.73_m2} (ref >=60)
GFR, EST NON AFRICAN AMERICAN: 78 mL/min/{1.73_m2} (ref >=60)
GLOBULIN: 2.8 g/dL (ref 1.9–3.7)
Glucose, Bld: 125 mg/dL — ABNORMAL HIGH (ref 65–99)
POTASSIUM: 4.6 mmol/L (ref 3.5–5.3)
SODIUM: 140 mmol/L (ref 135–146)
TOTAL PROTEIN: 7.1 g/dL (ref 6.1–8.1)
Total Bilirubin: 0.3 mg/dL (ref 0.2–1.2)

## 2017-12-20 LAB — TSH: TSH: 1.19 mIU/L (ref 0.40–4.50)

## 2017-12-20 LAB — HEMOGLOBIN A1C
EAG (MMOL/L): 7.9 (calc)
Hgb A1c MFr Bld: 6.6 % of total Hgb — ABNORMAL HIGH (ref <5.7)
MEAN PLASMA GLUCOSE: 143 (calc)

## 2017-12-20 LAB — VITAMIN D 1,25 DIHYDROXY
VITAMIN D 1, 25 (OH) TOTAL: 91 pg/mL — AB (ref 18–72)
VITAMIN D3 1, 25 (OH): 77 pg/mL
Vitamin D2 1, 25 (OH)2: 14 pg/mL

## 2017-12-20 LAB — LIPID PANEL W/REFLEX DIRECT LDL
CHOL/HDL RATIO: 4.2 (calc) (ref <5.0)
CHOLESTEROL: 304 mg/dL — AB (ref <200)
HDL: 73 mg/dL (ref >50)
LDL Cholesterol (Calc): 204 mg/dL (calc) — ABNORMAL HIGH
NON-HDL CHOLESTEROL (CALC): 231 mg/dL — AB (ref <130)
TRIGLYCERIDES: 129 mg/dL (ref <150)

## 2017-12-20 LAB — VITAMIN B12: VITAMIN B 12: 452 pg/mL (ref 200–1100)

## 2017-12-20 MED ORDER — ATORVASTATIN CALCIUM 40 MG PO TABS: 40.0000 mg | ORAL_TABLET | Freq: Every day | ORAL | 3 refills | Status: DC

## 2017-12-20 MED ORDER — EMPAGLIFLOZIN-METFORMIN HCL ER 10-1000 MG PO TB24: 1.0000 | ORAL_TABLET | Freq: Every day | ORAL | 2 refills | Status: DC

## 2017-12-20 NOTE — Progress Notes (Signed)
Call pt: vitamin D level is actually elevated. You do not need any extra vitamin D supplementation.

## 2017-12-23 ENCOUNTER — Ambulatory Visit: Payer: Self-pay | Admitting: Physician Assistant

## 2018-01-22 ENCOUNTER — Ambulatory Visit (INDEPENDENT_AMBULATORY_CARE_PROVIDER_SITE_OTHER): Payer: 59 | Admitting: Physician Assistant

## 2018-01-22 ENCOUNTER — Encounter: Payer: Self-pay | Admitting: Physician Assistant

## 2018-01-22 VITALS — BP 128/76 | HR 67 | Ht 62.01 in | Wt 228.0 lb

## 2018-01-22 DIAGNOSIS — F331 Major depressive disorder, recurrent, moderate: Secondary | ICD-10-CM

## 2018-01-22 DIAGNOSIS — E119 Type 2 diabetes mellitus without complications: Secondary | ICD-10-CM | POA: Diagnosis not present

## 2018-01-22 DIAGNOSIS — E782 Mixed hyperlipidemia: Secondary | ICD-10-CM

## 2018-01-22 LAB — POCT UA - MICROALBUMIN

## 2018-01-22 NOTE — Progress Notes (Signed)
   Subjective:    Patient ID: Christina King, female    DOB: Jul 16, 1961, 57 y.o.   MRN: [id number]  HPI Pt is a 57 yo with T2DM, HLD, MDD who presents to the clinic to follow up after restarting medication.   Pt has been taking Lipitor and synjardy daily. She has lost 8lbs. She is feeling a lot better. She is not checking her sugars. She denies any hypoglycemia. She has no side effects. She is not exercising.   Her mood is ok. She "still has the same stuff going on". She denies any SI/HC.   [name]. Active Ambulatory Problems    Diagnosis Date Noted  . Hyperlipidemia 02/24/2014  . Dependence on nicotine from other tobacco product 02/24/2014  . Tobacco abuse 02/24/2014  . Personal history of colonic polyps 02/24/2014  . Adjustment disorder with anxiety 03/16/2014  . Vitreous floaters of both eyes 06/15/2015  . Vitamin D deficiency 06/16/2015  . Controlled type 2 diabetes mellitus without complication, without long-term current use of insulin ([place]) 06/17/2015  . Idiopathic peripheral neuropathy 06/17/2015  . Alopecia areata 06/20/2015  . Type 2 diabetes mellitus without complication, without long-term current use of insulin ([place]) 06/20/2015  . Morbid obesity ([place]) 09/28/2015  . MDD (major depressive disorder), recurrent episode, moderate ([place]) 01/02/2016  . Presbyopia 03/07/2016  . Myopia of right eye 03/07/2016  . 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 Regular astigmatism of left eye 03/07/2016   Resolved Ambulatory Problems    Diagnosis Date Noted  . Obesity, unspecified 03/16/2014  . Obesity (BMI 30-39.9) 04/17/2016   Past Medical History:  Diagnosis Date  . Hyperlipidemia       Review of Systems  All other systems reviewed and are negative.      Objective:   Physical Exam  Constitutional: She is oriented to person, place, and time. She appears well-developed and well-nourished.  HENT:  Head: Normocephalic and atraumatic.  Cardiovascular: Normal rate  and regular rhythm.  Pulmonary/Chest: Effort normal and breath sounds normal.  Neurological: She is alert and oriented to person, place, and time.  Psychiatric: She has a normal mood and affect. Her behavior is normal.          Assessment & Plan:  Marland KitchenMarland KitchenAntroinette was seen today for lab results and weight check.  Diagnoses and all orders for this visit:  Type 2 diabetes mellitus without complication, without long-term current use of insulin (HCC) -     Cancel: Microalbumin, urine -     POCT UA - Microalbumin  Mixed hyperlipidemia  MDD (major depressive disorder), recurrent episode, moderate ([place])  Morbid obesity ([place])   .[name] Results for orders placed or performed in visit on 01/22/18  POCT UA - Microalbumin  Result Value Ref Range   Microalbumin Ur, POC 30mg /l mg/L   Creatinine, POC 100mg /dl mg/dL   Albumin/Creatinine Ratio, Urine, POC 30mg /g    Will check A!C in 3 months.  Pt is doing great with weight loss. Keep up the good work. Encouraged exercise.  On STATIn. Will check in 4-6 months.  BP looks great.  Encouraged to get eye exam.

## 2018-01-22 NOTE — Patient Instructions (Signed)
Diabetes Mellitus and Nutrition When you have diabetes (diabetes mellitus), it is very important to have healthy eating habits because your blood sugar (glucose) levels are greatly affected by what you eat and drink. Eating healthy foods in the appropriate amounts, at about the same times every day, can help you:  Control your blood glucose.  Lower your risk of heart disease.  Improve your blood pressure.  Reach or maintain a healthy weight.  Every person with diabetes is different, and each person has different needs for a meal plan. Your health care provider may recommend that you work with a diet and nutrition specialist (dietitian) to make a meal plan that is best for you. Your meal plan may vary depending on factors such as:  The calories you need.  The medicines you take.  Your weight.  Your blood glucose, blood pressure, and cholesterol levels.  Your activity level.  Other health conditions you have, such as heart or kidney disease.  How do carbohydrates affect me? Carbohydrates affect your blood glucose level more than any other type of food. Eating carbohydrates naturally increases the amount of glucose in your blood. Carbohydrate counting is a method for keeping track of how many carbohydrates you eat. Counting carbohydrates is important to keep your blood glucose at a healthy level, especially if you use insulin or take certain oral diabetes medicines. It is important to know how many carbohydrates you can safely have in each meal. This is different for every person. Your dietitian can help you calculate how many carbohydrates you should have at each meal and for snack. Foods that contain carbohydrates include:  Bread, cereal, rice, pasta, and crackers.  Potatoes and corn.  Peas, beans, and lentils.  Milk and yogurt.  Fruit and juice.  Desserts, such as cakes, cookies, ice cream, and candy.  How does alcohol affect me? Alcohol can cause a sudden decrease in blood  glucose (hypoglycemia), especially if you use insulin or take certain oral diabetes medicines. Hypoglycemia can be a life-threatening condition. Symptoms of hypoglycemia (sleepiness, dizziness, and confusion) are similar to symptoms of having too much alcohol. If your health care provider says that alcohol is safe for you, follow these guidelines:  Limit alcohol intake to no more than 1 drink per day for nonpregnant women and 2 drinks per day for men. One drink equals 12 oz of beer, 5 oz of wine, or 1 oz of hard liquor.  Do not drink on an empty stomach.  Keep yourself hydrated with water, diet soda, or unsweetened iced tea.  Keep in mind that regular soda, juice, and other mixers may contain a lot of sugar and must be counted as carbohydrates.  What are tips for following this plan? Reading food labels  Start by checking the serving size on the label. The amount of calories, carbohydrates, fats, and other nutrients listed on the label are based on one serving of the food. Many foods contain more than one serving per package.  Check the total grams (g) of carbohydrates in one serving. You can calculate the number of servings of carbohydrates in one serving by dividing the total carbohydrates by 15. For example, if a food has 30 g of total carbohydrates, it would be equal to 2 servings of carbohydrates.  Check the number of grams (g) of saturated and trans fats in one serving. Choose foods that have low or no amount of these fats.  Check the number of milligrams (mg) of sodium in one serving. Most people   should limit total sodium intake to less than 2,300 mg per day.  Always check the nutrition information of foods labeled as "low-fat" or "nonfat". These foods may be higher in added sugar or refined carbohydrates and should be avoided.  Talk to your dietitian to identify your daily goals for nutrients listed on the label. Shopping  Avoid buying canned, premade, or processed foods. These  foods tend to be high in fat, sodium, and added sugar.  Shop around the outside edge of the grocery store. This includes fresh fruits and vegetables, bulk grains, fresh meats, and fresh dairy. Cooking  Use low-heat cooking methods, such as baking, instead of high-heat cooking methods like deep frying.  Cook using healthy oils, such as olive, canola, or sunflower oil.  Avoid cooking with butter, cream, or high-fat meats. Meal planning  Eat meals and snacks regularly, preferably at the same times every day. Avoid going long periods of time without eating.  Eat foods high in fiber, such as fresh fruits, vegetables, beans, and whole grains. Talk to your dietitian about how many servings of carbohydrates you can eat at each meal.  Eat 4-6 ounces of lean protein each day, such as lean meat, chicken, fish, eggs, or tofu. 1 ounce is equal to 1 ounce of meat, chicken, or fish, 1 egg, or 1/4 cup of tofu.  Eat some foods each day that contain healthy fats, such as avocado, nuts, seeds, and fish. Lifestyle   Check your blood glucose regularly.  Exercise at least 30 minutes 5 or more days each week, or as told by your health care provider.  Take medicines as told by your health care provider.  Do not use any products that contain nicotine or tobacco, such as cigarettes and e-cigarettes. If you need help quitting, ask your health care provider.  Work with a counselor or diabetes educator to identify strategies to manage stress and any emotional and social challenges. What are some questions to ask my health care provider?  Do I need to meet with a diabetes educator?  Do I need to meet with a dietitian?  What number can I call if I have questions?  When are the best times to check my blood glucose? Where to find more information:  American Diabetes Association: diabetes.org/food-and-fitness/food  Academy of Nutrition and Dietetics:  www.eatright.org/resources/health/diseases-and-conditions/diabetes  National Institute of Diabetes and Digestive and Kidney Diseases (NIH): www.niddk.nih.gov/health-information/diabetes/overview/diet-eating-physical-activity Summary  A healthy meal plan will help you control your blood glucose and maintain a healthy lifestyle.  Working with a diet and nutrition specialist (dietitian) can help you make a meal plan that is best for you.  Keep in mind that carbohydrates and alcohol have immediate effects on your blood glucose levels. It is important to count carbohydrates and to use alcohol carefully. This information is not intended to replace advice given to you by your health care provider. Make sure you discuss any questions you have with your health care provider. Document Released: 03/29/2005 Document Revised: 08/06/2016 Document Reviewed: 08/06/2016 Elsevier Interactive Patient Education  2018 Elsevier Inc.  

## 2018-02-07 ENCOUNTER — Ambulatory Visit (INDEPENDENT_AMBULATORY_CARE_PROVIDER_SITE_OTHER): Payer: 59 | Admitting: Family Medicine

## 2018-02-07 VITALS — BP 120/85 | HR 61 | Temp 98.1°F

## 2018-02-07 DIAGNOSIS — Z23 Encounter for immunization: Secondary | ICD-10-CM | POA: Diagnosis not present

## 2018-02-07 NOTE — Progress Notes (Signed)
Christina King presents to clinic for her first dose of shingrix vaccine.  She tolerated injection well in the right deltoid without any complications. -EH/RMA

## 2018-02-21 ENCOUNTER — Telehealth: Payer: Self-pay

## 2018-02-21 NOTE — Telephone Encounter (Signed)
Pt called with some issues of refilling her SYNJARDY .   I called [facility] and they state that although insurance paid for the initial fill and one refill, but are now requiring prior auth.   Christina King, pt has 8 days left of this RX. Can you please help look into this and see if we can get RX covered?   Thanks!

## 2018-02-21 NOTE — Telephone Encounter (Signed)
Pt called- she talked to ins and fount out that she must use [facility] with her insurance.  I [facility] and had them [facility]

## 2018-02-21 NOTE — Telephone Encounter (Signed)
Attempted the PA for the patient and I received a message from [facility] that the patient would need to contact her insurance to give the office permission to authorize a PA for this patient. Called patient and she will call them during her break.

## 2018-02-27 ENCOUNTER — Other Ambulatory Visit: Payer: Self-pay

## 2018-02-27 MED ORDER — EMPAGLIFLOZIN-METFORMIN HCL ER 10-1000 MG PO TB24: 1.0000 | ORAL_TABLET | Freq: Every day | ORAL | 1 refills | Status: DC

## 2018-03-03 ENCOUNTER — Other Ambulatory Visit: Payer: Self-pay

## 2018-03-03 MED ORDER — ATORVASTATIN CALCIUM 40 MG PO TABS: 40.0000 mg | ORAL_TABLET | Freq: Every day | ORAL | 3 refills | Status: DC

## 2018-04-10 LAB — HM DIABETES EYE EXAM

## 2018-04-18 ENCOUNTER — Encounter: Payer: Self-pay | Admitting: Physician Assistant

## 2018-04-23 ENCOUNTER — Ambulatory Visit (INDEPENDENT_AMBULATORY_CARE_PROVIDER_SITE_OTHER): Payer: 59 | Admitting: Family Medicine

## 2018-04-23 ENCOUNTER — Encounter: Payer: Self-pay | Admitting: Family Medicine

## 2018-04-23 VITALS — BP 143/78 | HR 72 | Temp 98.6°F | Ht 62.0 in | Wt 226.0 lb

## 2018-04-23 DIAGNOSIS — R1310 Dysphagia, unspecified: Secondary | ICD-10-CM

## 2018-04-23 MED ORDER — FAMOTIDINE 40 MG/5ML PO SUSR: 40.0000 mg | Freq: Two times a day (BID) | ORAL | 0 refills | Status: DC

## 2018-04-23 NOTE — Patient Instructions (Addendum)
Thank you for coming in today. Try swallowing soft foods like apple sauce or pudding.  You should hear about referral soon.  Return sooner if needed.  Take the liquid antiacid twice daily.   Dysphagia Dysphagia is trouble swallowing. This condition occurs when solids and liquids stick in a person's throat on the way down to the stomach, or when food takes longer to get to the stomach. You may have problems swallowing food, liquids, or both. You may also have pain while trying to swallow. It may take you more time and effort to swallow something. What are the causes? This condition is caused by:  Problems with the muscles. They may make it difficult for you to move food and liquids through the tube that connects your mouth to your stomach (esophagus). You may have ulcers, scar tissue, or inflammation that blocks the normal passage of food and liquids. Causes of these problems include: ? Acid reflux from your stomach into your esophagus (gastroesophageal reflux). ? Infections. ? Radiation treatment for cancer. ? Medicines taken without enough fluids to wash them down into your stomach.  Nerve problems. These prevent signals from being sent to the muscles of your esophagus to squeeze (contract) and move what you swallow down to your stomach.  Globus pharyngeus. This is a common problem that involves feeling like something is stuck in the throat or a sense of trouble with swallowing even though nothing is wrong with the swallowing passages.  Stroke. This can affect the nerves and make it difficult to swallow.  Certain conditions, such as cerebral palsy or Parkinson disease.  What are the signs or symptoms? Common symptoms of this condition include:  A feeling that solids or liquids are stuck in your throat on the way down to the stomach.  Food taking too long to get to the stomach.  Other symptoms include:  Food moving back from your stomach to your mouth (regurgitation).  Noises  coming from your throat.  Chest discomfort with swallowing.  A feeling of fullness when swallowing.  Drooling, especially when the throat is blocked.  Pain while swallowing.  Heartburn.  Coughing or gagging while trying to swallow.  How is this diagnosed? This condition is diagnosed by:  Barium X-ray. In this test, you swallow a white substance (contrast medium)that sticks to the inside of your esophagus. X-ray images are then taken.  Endoscopy. In this test, a flexible telescope is inserted down your throat to look at your esophagus and your stomach.  CT scans and MRI.  How is this treated? Treatment for dysphagia depends on the cause of the condition:  If the dysphagia is caused by acid reflux or infection, medicines may be used. They may include antibiotics and heartburn medicines.  If the dysphagia is caused by problems with your muscles, swallowing therapy may be used to help you strengthen your swallowing muscles. You may have to do specific exercises to strengthen the muscles or stretch them.  If the dysphagia is caused by a blockage or mass, procedures to remove the blockage may be done. You may need surgery and a feeding tube.  You may need to make diet changes. Ask your health care provider for specific instructions. Follow these instructions at home: Eating and drinking  Try to eat soft food that is easier to swallow.  Follow any diet changes as told by your health care provider.  Cut your food into small pieces and eat slowly.  Eat and drink only when you are sitting upright.  Do not drink alcohol or caffeine. If you need help quitting, ask your health care provider. General instructions  Check your weight every day to make sure you are not losing weight.  Take over-the-counter and prescription medicines only as told by your health care provider.  If you were prescribed an antibiotic medicine, take it as told by your health care provider. Do not stop  taking the antibiotic even if you start to feel better.  Do not use any products that contain nicotine or tobacco, such as cigarettes and e-cigarettes. If you need help quitting, ask your health care provider.  Keep all follow-up visits as told by your health care provider. This is important. Contact a health care provider if:  You lose weight because you cannot swallow.  You cough when you drink liquids (aspiration).  You cough up partially digested food. Get help right away if:  You cannot swallow your saliva.  You have shortness of breath or a fever, or both.  You have a hoarse voice and also have trouble swallowing. Summary  Dysphagia is trouble swallowing. This condition occurs when solids and liquids stick in a person's throat on the way down to the stomach, or when food takes longer to get to the stomach.  Dysphagia has many possible causes and symptoms.  Treatment for dysphagia depends on the cause of the condition. This information is not intended to replace advice given to you by your health care provider. Make sure you discuss any questions you have with your health care provider. Document Released: 06/29/2000 Document Revised: 06/21/2016 Document Reviewed: 06/21/2016 Elsevier Interactive Patient Education  2017 [employer].

## 2018-04-23 NOTE — Progress Notes (Signed)
Christina King is a 57 y.o. female who presents to [facility] today for difficulty swallowing.   Christina King awoke this morning and had difficulty swallowing water.  She is able to get the water down but notes that she has to work a bit to swallow completely.  She is been afraid to try eating solids due to fear of the food becoming obstructed.  She is not sure if she took her pills or not this morning.  She denies fevers or chills nausea vomiting or diarrhea.  She is never had anything like this happen before.  She has a pertinent medical history for diabetes and tobacco use.  She denies significant acid reflux symptoms prior to today's episode.   ROS as above:  Exam:  BP (!) 143/78   Pulse 72   Temp 98.6 F (37 C) (Oral)   Ht 5\' 2"  (1.575 m)   Wt 226 lb (102.5 kg)   BMI 41.34 kg/m  Wt Readings from Last 5 Encounters:  04/23/18 226 lb (102.5 kg)  01/22/18 228 lb (103.4 kg)  12/18/17 231 lb 4.8 oz (104.9 kg)  04/16/16 197 lb (89.4 kg)  02/13/16 197 lb (89.4 kg)    Gen: Well NAD HEENT: EOMI,  MMM posterior pharynx is normal-appearing normal palate elevation uvula midline.  Normal tongue motion.  No cervical masses or lymphadenopathy. Lungs: Normal work of breathing. CTABL Heart: RRR no MRG Abd: NABS, Soft. Nondistended, Nontender Exts: Brisk capillary refill, warm and well perfused.  Cranial nerves exam is normal.  Patient was observed to swallow and had a normal voice following swallowing water and applesauce.   Assessment and Plan: 57 y.o. female with dysphagia new onset.  Unclear etiology.  Patient is able to swallow soft foods such as applesauce as well as liquids today.  She does have some risk factors concerning for stricture or mass.  Plan for gastro and neurology referral suspect likely will benefit from EGD in the near future.  If patient  develops obstructive dysphasia she will present to the emergency room.  Otherwise hopefully will get in with ENT soon.  Recheck as needed.  We will go ahead and prescribe a liquid famotidine as well as this may be helpful.   Orders Placed This Encounter  Procedures  . Ambulatory referral to Gastroenterology    Referral Priority:   Urgent    Referral Type:   Consultation    Referral Reason:   Specialty Services Required    Referred to Provider:   Lavena Bullion, DO    Number of Visits Requested:   1   Meds ordered this encounter  Medications  . famotidine (PEPCID) 40 MG/5ML suspension    Sig: Take 5 mLs (40 mg total) by mouth 2 (two) times daily.    Dispense:  200 mL    Refill:  0     Historical information moved to improve visibility of documentation.  Past Medical History:  Diagnosis Date  . Hyperlipidemia    Past Surgical History:  Procedure Laterality Date  . ABDOMINAL HYSTERECTOMY    . TUBAL LIGATION     Social History   Tobacco Use  . Smoking status: Former Smoker    Types: Cigarettes    Last attempt to quit: 06/20/2014    Years since quitting: 3.8  . Smokeless tobacco: Never Used  Substance Use Topics  . Alcohol use: Yes    Alcohol/week: 0.0 standard drinks  family history includes Cancer in her paternal aunt; Depression in her brother; Diabetes in her father, mother, and sister; Hyperlipidemia in her father; Hypertension in her father and mother.  Medications: Current Outpatient Medications  Medication Sig Dispense Refill  . atorvastatin (LIPITOR) 40 MG tablet Take 1 tablet (40 mg total) by mouth daily. 90 tablet 3  . Empagliflozin-metFORMIN HCl ER (SYNJARDY XR) 04-999 MG TB24 Take 1 tablet by mouth daily. 90 tablet 1  . famotidine (PEPCID) 40 MG/5ML suspension Take 5 mLs (40 mg total) by mouth 2 (two) times daily. 200 mL 0   No current facility-administered medications for this visit.    Allergies  Allergen Reactions  . Darvon [Propoxyphene]  Itching    Darvocet     Discussed warning signs or symptoms. Please see discharge instructions. Patient expresses understanding.

## 2018-04-25 ENCOUNTER — Ambulatory Visit (INDEPENDENT_AMBULATORY_CARE_PROVIDER_SITE_OTHER): Payer: 59 | Admitting: Physician Assistant

## 2018-04-25 ENCOUNTER — Encounter: Payer: Self-pay | Admitting: Physician Assistant

## 2018-04-25 VITALS — BP 130/69 | HR 68 | Ht 62.0 in | Wt 225.0 lb

## 2018-04-25 DIAGNOSIS — Z23 Encounter for immunization: Secondary | ICD-10-CM | POA: Diagnosis not present

## 2018-04-25 DIAGNOSIS — E782 Mixed hyperlipidemia: Secondary | ICD-10-CM | POA: Diagnosis not present

## 2018-04-25 DIAGNOSIS — E1169 Type 2 diabetes mellitus with other specified complication: Secondary | ICD-10-CM

## 2018-04-25 LAB — POCT GLYCOSYLATED HEMOGLOBIN (HGB A1C): Hemoglobin A1C: 6.5 % — AB (ref 4.0–5.6)

## 2018-04-25 MED ORDER — EMPAGLIFLOZIN-METFORMIN HCL ER 10-1000 MG PO TB24: 1.0000 | ORAL_TABLET | Freq: Every day | ORAL | 0 refills | Status: DC

## 2018-04-25 NOTE — Progress Notes (Signed)
   Subjective:    Patient ID: Christina King, female    DOB: 06-18-1961, 57 y.o.   MRN: [id number]  HPI  Pt is a 57 yo obese female with T2DM, MDD who presents to the clinic for 3 month follow up.   DM- not checking sugars. On synjardy. No complaints. No hypoglycemia. No open sores or wounds. Not really watching diet or exercising. Started lipitor in June. No problems.   Was seen for dysphagia a few days ago. Has GI appt. Symptoms resolved that night. She is taking pepcid.   .. Active Ambulatory Problems    Diagnosis Date Noted  . Hyperlipidemia 02/24/2014  . Dependence on nicotine from other tobacco product 02/24/2014  . Tobacco abuse 02/24/2014  . Personal history of colonic polyps 02/24/2014  . Adjustment disorder with anxiety 03/16/2014  . Vitreous floaters of both eyes 06/15/2015  . Vitamin D deficiency 06/16/2015  . Controlled type 2 diabetes mellitus without complication, without long-term current use of insulin ([place]) 06/17/2015  . Idiopathic peripheral neuropathy 06/17/2015  . Alopecia areata 06/20/2015  . Type 2 diabetes mellitus without complication, without long-term current use of insulin ([place]) 06/20/2015  . Morbid obesity ([place]) 09/28/2015  . MDD (major depressive disorder), recurrent episode, moderate ([place]) 01/02/2016  . Presbyopia 03/07/2016  . Myopia of right eye 03/07/2016  . 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 Regular astigmatism of left eye 03/07/2016   Resolved Ambulatory Problems    Diagnosis Date Noted  . Obesity, unspecified 03/16/2014  . Obesity (BMI 30-39.9) 04/17/2016   No Additional Past Medical History     Review of Systems See HPI.     Objective:   Physical Exam  Constitutional: She is oriented to person, place, and time. She appears well-developed and well-nourished.  HENT:  Head: Normocephalic and atraumatic.  Cardiovascular: Normal rate and regular rhythm.  Pulmonary/Chest: Effort normal and breath sounds normal.   Neurological: She is alert and oriented to person, place, and time.  Psychiatric: She has a normal mood and affect. Her behavior is normal.          Assessment & Plan:  Marland KitchenMarland KitchenDiagnoses and all orders for this visit:  Controlled type 2 diabetes mellitus with other specified complication, without long-term current use of insulin (HCC) -     Empagliflozin-metFORMIN HCl ER (SYNJARDY XR) 04-999 MG TB24; Take 1 tablet by mouth daily. -     POCT glycosylated hemoglobin (Hb A1C)  Mixed hyperlipidemia  Need for shingles vaccine -     Varicella-zoster vaccine IM (Shingrix)  Morbid obesity ([place])   .. Lab Results  Component Value Date   HGBA1C 6.5 (A) 04/25/2018   A!C is controlled.  Continue on same medications.  On STATIN.   .. Diabetic Foot Exam - Simple   Simple Foot Form Visual Inspection No deformities, no ulcerations, no other skin breakdown bilaterally:  Yes Sensation Testing Intact to touch and monofilament testing bilaterally:  Yes Pulse Check Posterior Tibialis and Dorsalis pulse intact bilaterally:  Yes Comments   continue to work on weight loss and healthy living.  Follow up in 3 months.   Continue on pepcid and follow up with GI. I am glad symptoms have resolved.

## 2018-05-08 ENCOUNTER — Encounter: Payer: Self-pay | Admitting: Family Medicine

## 2018-07-25 ENCOUNTER — Ambulatory Visit: Payer: Self-pay | Admitting: Physician Assistant

## 2018-07-29 ENCOUNTER — Ambulatory Visit (INDEPENDENT_AMBULATORY_CARE_PROVIDER_SITE_OTHER): Payer: BLUE CROSS/BLUE SHIELD | Admitting: Physician Assistant

## 2018-07-29 ENCOUNTER — Encounter: Payer: Self-pay | Admitting: Physician Assistant

## 2018-07-29 VITALS — BP 141/79 | HR 70 | Ht 62.0 in | Wt 219.0 lb

## 2018-07-29 DIAGNOSIS — E785 Hyperlipidemia, unspecified: Secondary | ICD-10-CM | POA: Diagnosis not present

## 2018-07-29 DIAGNOSIS — F4321 Adjustment disorder with depressed mood: Secondary | ICD-10-CM | POA: Diagnosis not present

## 2018-07-29 DIAGNOSIS — Z1211 Encounter for screening for malignant neoplasm of colon: Secondary | ICD-10-CM

## 2018-07-29 DIAGNOSIS — E1169 Type 2 diabetes mellitus with other specified complication: Secondary | ICD-10-CM

## 2018-07-29 DIAGNOSIS — F4320 Adjustment disorder, unspecified: Secondary | ICD-10-CM | POA: Insufficient documentation

## 2018-07-29 LAB — LIPID PANEL W/REFLEX DIRECT LDL
Cholesterol: 159 mg/dL (ref <200)
HDL: 66 mg/dL (ref >50)
LDL Cholesterol (Calc): 77 mg/dL (calc)
NON-HDL CHOLESTEROL (CALC): 93 mg/dL (ref <130)
TRIGLYCERIDES: 80 mg/dL (ref <150)
Total CHOL/HDL Ratio: 2.4 (calc) (ref <5.0)

## 2018-07-29 LAB — COMPLETE METABOLIC PANEL WITH GFR
AG Ratio: 1.8 (calc) (ref 1.0–2.5)
ALT: 32 U/L — AB (ref 6–29)
AST: 27 U/L (ref 10–35)
Albumin: 4.2 g/dL (ref 3.6–5.1)
Alkaline phosphatase (APISO): 78 U/L (ref 33–130)
BILIRUBIN TOTAL: 0.4 mg/dL (ref 0.2–1.2)
BUN: 11 mg/dL (ref 7–25)
CALCIUM: 9.4 mg/dL (ref 8.6–10.4)
CHLORIDE: 104 mmol/L (ref 98–110)
CO2: 28 mmol/L (ref 20–32)
CREATININE: 0.8 mg/dL (ref 0.50–1.05)
GFR, EST AFRICAN AMERICAN: 94 mL/min/{1.73_m2} (ref >=60)
GFR, Est Non African American: 81 mL/min/{1.73_m2} (ref >=60)
GLUCOSE: 108 mg/dL — AB (ref 65–99)
Globulin: 2.4 g/dL (calc) (ref 1.9–3.7)
Potassium: 4.3 mmol/L (ref 3.5–5.3)
Sodium: 142 mmol/L (ref 135–146)
TOTAL PROTEIN: 6.6 g/dL (ref 6.1–8.1)

## 2018-07-29 LAB — POCT GLYCOSYLATED HEMOGLOBIN (HGB A1C): Hemoglobin A1C: 6 % — AB (ref 4.0–5.6)

## 2018-07-29 MED ORDER — EMPAGLIFLOZIN-METFORMIN HCL ER 10-1000 MG PO TB24: 1.0000 | ORAL_TABLET | Freq: Every day | ORAL | 0 refills | Status: DC

## 2018-07-29 NOTE — Progress Notes (Signed)
Subjective:    Patient ID: Christina King, female    DOB: Nov 30, 1960, 58 y.o.   MRN: [id number]  HPI  Pt is a 58 yo female with T2DM, HLD who presents to the clinic for 3 month follow up.   Pt is down 6lbs. She is not checking sugars. She got an apple watch and trying to get 10,000 steps in a day. Last a1c was 6.5. no hypoglycemic events. She is taking her medication daily.   Pt has had a rough year. Her sister suddenly passed away from DKA from uncontrolled sugars due to T1DM. Her father in law is also in the ICU after a stroke and probably will not make it. She is very tearful and just feels overwhelmed. No SI/HC.   [name]. Active Ambulatory Problems    Diagnosis Date Noted  . Hyperlipidemia 02/24/2014  . Dependence on nicotine from other tobacco product 02/24/2014  . Tobacco abuse 02/24/2014  . Personal history of colonic polyps 02/24/2014  . Adjustment disorder with anxiety 03/16/2014  . Vitreous floaters of both eyes 06/15/2015  . Vitamin D deficiency 06/16/2015  . Controlled type 2 diabetes mellitus without complication, without long-term current use of insulin ([place]) 06/17/2015  . Idiopathic peripheral neuropathy 06/17/2015  . Alopecia areata 06/20/2015  . Type 2 diabetes mellitus without complication, without long-term current use of insulin ([place]) 06/20/2015  . Morbid obesity ([place]) 09/28/2015  . MDD (major depressive disorder), recurrent episode, moderate ([place]) 01/02/2016  . Presbyopia 03/07/2016  . Myopia of right eye 03/07/2016  . 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 Regular astigmatism of left eye 03/07/2016  . Grief reaction 07/29/2018  . Hyperlipidemia associated with type 2 diabetes mellitus ([place]) 07/29/2018   Resolved Ambulatory Problems    Diagnosis Date Noted  . Obesity, unspecified 03/16/2014  . Obesity (BMI 30-39.9) 04/17/2016   No Additional Past Medical History      Review of Systems  All other systems reviewed and are negative.     Objective:   Physical Exam Vitals signs reviewed.  Constitutional:      Appearance: Normal appearance.  HENT:     Head: Normocephalic and atraumatic.  Cardiovascular:     Rate and Rhythm: Normal rate and regular rhythm.     Pulses: Normal pulses.  Pulmonary:     Effort: Pulmonary effort is normal.     Breath sounds: Normal breath sounds.  Neurological:     General: No focal deficit present.     Mental Status: She is alert and oriented to person, place, and time.  Psychiatric:        Mood and Affect: Mood normal.        Behavior: Behavior normal.           Assessment & Plan:  Marland KitchenMarland KitchenAntroinette was seen today for diabetes.  Diagnoses and all orders for this visit:  Controlled type 2 diabetes mellitus with other specified complication, without long-term current use of insulin (HCC) -     POCT glycosylated hemoglobin (Hb A1C) -     COMPLETE METABOLIC PANEL WITH GFR -     Empagliflozin-metFORMIN HCl ER (SYNJARDY XR) 04-999 MG TB24; Take 1 tablet by mouth daily.  Colon cancer screening -     Ambulatory referral to Gastroenterology  Hyperlipidemia associated with type 2 diabetes mellitus ([place]) -     Lipid Panel w/reflex Direct LDL  Grief reaction  .[name] Results for orders placed or performed in visit on 07/29/18  POCT glycosylated hemoglobin (Hb  A1C)  Result Value Ref Range   Hemoglobin A1C 6.0 (A) 4.0 - 5.6 %   HbA1c POC (<> result, manual entry)     HbA1c, POC (prediabetic range)     HbA1c, POC (controlled diabetic range)     a1c down which is great.  Continue on same medications.  On STATIN.  BP not to goal today. She is upset about death in family. Pt declined ACE/ARB today. Will continue to monitor.  Vaccines up to date.  Follow up in 3 months.   Colonoscopy ordered.  Mammogram ordered pt just needs to get.   Grief discussed. Encouraged patient to reach out to hospice for free counseling as needed. Discussed stages of grief. Follow up as needed if not  improving or if worsening.   [name].Spent 30 minutes with patient and greater than 50 percent of visit spent counseling patient regarding treatment plan.

## 2018-07-30 NOTE — Progress Notes (Signed)
Call pt: your cholesterol responding beautifully to lipitor. GREAT news. Other labs look good. One liver enzyme just a hair up will continue to monitor.

## 2018-08-05 NOTE — Progress Notes (Signed)
Fax request for records. When we get them we can see to current GI and they can schedule.

## 2018-08-06 ENCOUNTER — Ambulatory Visit (INDEPENDENT_AMBULATORY_CARE_PROVIDER_SITE_OTHER): Payer: BLUE CROSS/BLUE SHIELD

## 2018-08-06 DIAGNOSIS — R928 Other abnormal and inconclusive findings on diagnostic imaging of breast: Secondary | ICD-10-CM

## 2018-08-06 NOTE — Progress Notes (Signed)
Call pt: mammogram findings show possible right breast mass and need more imaging. They should contact you and set you up for imaging in the near future.

## 2018-08-07 ENCOUNTER — Other Ambulatory Visit: Payer: Self-pay | Admitting: Physician Assistant

## 2018-08-07 DIAGNOSIS — R928 Other abnormal and inconclusive findings on diagnostic imaging of breast: Secondary | ICD-10-CM

## 2018-08-12 ENCOUNTER — Ambulatory Visit
Admission: RE | Admit: 2018-08-12 | Discharge: 2018-08-12 | Disposition: A | Discharge status: Home / Self Care | Payer: BLUE CROSS/BLUE SHIELD | Source: Ambulatory Visit | Attending: Physician Assistant | Admitting: Physician Assistant

## 2018-08-12 ENCOUNTER — Other Ambulatory Visit: Payer: Self-pay | Admitting: Physician Assistant

## 2018-08-12 DIAGNOSIS — R928 Other abnormal and inconclusive findings on diagnostic imaging of breast: Secondary | ICD-10-CM

## 2018-08-12 DIAGNOSIS — N6310 Unspecified lump in the right breast, unspecified quadrant: Secondary | ICD-10-CM

## 2018-08-13 ENCOUNTER — Encounter: Payer: Self-pay | Admitting: Physician Assistant

## 2018-08-13 DIAGNOSIS — D126 Benign neoplasm of colon, unspecified: Secondary | ICD-10-CM | POA: Insufficient documentation

## 2018-08-14 ENCOUNTER — Encounter: Payer: Self-pay | Admitting: Physician Assistant

## 2018-08-14 ENCOUNTER — Ambulatory Visit
Admission: RE | Admit: 2018-08-14 | Discharge: 2018-08-14 | Disposition: A | Discharge status: Home / Self Care | Payer: BLUE CROSS/BLUE SHIELD | Source: Ambulatory Visit | Attending: Physician Assistant | Admitting: Physician Assistant

## 2018-08-14 DIAGNOSIS — N6310 Unspecified lump in the right breast, unspecified quadrant: Secondary | ICD-10-CM

## 2018-08-17 NOTE — Progress Notes (Signed)
Call pt: GREAT news. Fibrocystic changes no CANCER.

## 2018-09-04 ENCOUNTER — Ambulatory Visit: Payer: BLUE CROSS/BLUE SHIELD | Admitting: Family Medicine

## 2018-09-05 ENCOUNTER — Encounter: Payer: Self-pay | Admitting: Physician Assistant

## 2018-09-05 ENCOUNTER — Ambulatory Visit (INDEPENDENT_AMBULATORY_CARE_PROVIDER_SITE_OTHER): Payer: BLUE CROSS/BLUE SHIELD | Admitting: Physician Assistant

## 2018-09-05 VITALS — BP 129/72 | HR 64 | Temp 98.6°F | Ht 62.0 in | Wt 216.0 lb

## 2018-09-05 DIAGNOSIS — R42 Dizziness and giddiness: Secondary | ICD-10-CM

## 2018-09-05 DIAGNOSIS — R197 Diarrhea, unspecified: Secondary | ICD-10-CM | POA: Diagnosis not present

## 2018-09-05 DIAGNOSIS — H9202 Otalgia, left ear: Secondary | ICD-10-CM | POA: Diagnosis not present

## 2018-09-05 DIAGNOSIS — E119 Type 2 diabetes mellitus without complications: Secondary | ICD-10-CM | POA: Diagnosis not present

## 2018-09-05 DIAGNOSIS — H6982 Other specified disorders of Eustachian tube, left ear: Secondary | ICD-10-CM | POA: Diagnosis not present

## 2018-09-05 DIAGNOSIS — H8111 Benign paroxysmal vertigo, right ear: Secondary | ICD-10-CM | POA: Insufficient documentation

## 2018-09-05 DIAGNOSIS — K149 Disease of tongue, unspecified: Secondary | ICD-10-CM

## 2018-09-05 DIAGNOSIS — H6121 Impacted cerumen, right ear: Secondary | ICD-10-CM

## 2018-09-05 MED ORDER — METHYLPREDNISOLONE 4 MG PO TBPK: ORAL_TABLET | ORAL | 0 refills | Status: DC

## 2018-09-05 MED ORDER — CETIRIZINE HCL 10 MG PO TABS: 10.0000 mg | ORAL_TABLET | Freq: Every day | ORAL | 11 refills | Status: DC

## 2018-09-05 MED ORDER — FLUTICASONE PROPIONATE 50 MCG/ACT NA SUSP: 2.0000 | Freq: Every day | NASAL | 6 refills | Status: DC

## 2018-09-05 NOTE — Progress Notes (Signed)
Subjective:    Patient ID: Christina King, female    DOB: 1961-03-27, 58 y.o.   MRN: [id number]  HPI  Patient presents today with left ear pain and feeling dizzy. She was sick a week ago with chills, bodyaches, rhinorrhea, and diarrhea. She said that all got better though over the weekend. More recently she complains of itchy nose, tongue,  throat, and eyes. She is sneezing more and has ear pain. She also mentions that she had 4 episodes of diarrhea yesterday but has had none today. She denies eating anything raw or trying new food. She has not changed face lotions, detergents or make up. She is unsure what could be causing her allergy symptoms. She denies any recent antibiotic use or recent travel. She also has not been around anyone recently sick. Her dizziness has made her nervous to drive. She feels like things are moving in her peripheral vision.   .. Active Ambulatory Problems    Diagnosis Date Noted  . Hyperlipidemia 02/24/2014  . Dependence on nicotine from other tobacco product 02/24/2014  . Tobacco abuse 02/24/2014  . Personal history of colonic polyps 02/24/2014  . Adjustment disorder with anxiety 03/16/2014  . Vitreous floaters of both eyes 06/15/2015  . Vitamin D deficiency 06/16/2015  . Controlled type 2 diabetes mellitus without complication, without long-term current use of insulin ([place]) 06/17/2015  . Idiopathic peripheral neuropathy 06/17/2015  . Alopecia areata 06/20/2015  . Type 2 diabetes mellitus without complication, without long-term current use of insulin ([place]) 06/20/2015  . Morbid obesity ([place]) 09/28/2015  . MDD (major depressive disorder), recurrent episode, moderate ([place]) 01/02/2016  . Presbyopia 03/07/2016  . Myopia of right eye 03/07/2016  . 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 Regular astigmatism of left eye 03/07/2016  . Grief reaction 07/29/2018  . Hyperlipidemia associated with type 2 diabetes mellitus ([place]) 07/29/2018  .  Adenomatous colon polyp 08/13/2018  . Dizziness 09/05/2018  . BPPV (benign paroxysmal positional vertigo), right 09/05/2018  . ETD (Eustachian tube dysfunction), left 09/05/2018  . Diarrhea 09/05/2018  . Left ear pain 09/05/2018  . Tongue irritation 09/05/2018  . Right ear impacted cerumen 09/05/2018   Resolved Ambulatory Problems    Diagnosis Date Noted  . Obesity, unspecified 03/16/2014  . Obesity (BMI 30-39.9) 04/17/2016   No Additional Past Medical History     Review of Systems See HPI    Objective:   Physical Exam Constitutional:      Appearance: Normal appearance.  HENT:     Head: Normocephalic.     Right Ear: There is impacted cerumen.     Ears:     Comments: Left ear there is fluid present behind her TM. Cone of light diminished     Nose:     Comments: Turbinates enlarged and erythematous bilaterally    Mouth/Throat:     Pharynx: Oropharynx is clear.  Eyes:     Conjunctiva/sclera: Conjunctivae normal.  Cardiovascular:     Rate and Rhythm: Normal rate and regular rhythm.  Pulmonary:     Breath sounds: Normal breath sounds. No wheezing.  Abdominal:     General: There is no distension.     Palpations: Abdomen is soft.     Tenderness: There is no abdominal tenderness. There is no guarding.     Comments: Hypoactive bowelsounds  Skin:    General: Skin is warm.     Capillary Refill: Capillary refill takes less than 2 seconds.  Neurological:     General:  No focal deficit present.     Mental Status: She is alert.     Gait: Gait is intact.     Comments: Patient had positive Dix-Hallpike on right side with some horizontal nystagmus present   Psychiatric:        Mood and Affect: Mood normal.        Behavior: Behavior normal.           Assessment & Plan:  Marland KitchenMarland KitchenAntroinette was seen today for ear pain and diarrhea.  Diagnoses and all orders for this visit:  ETD (Eustachian tube dysfunction), left -     methylPREDNISolone (MEDROL DOSEPAK) 4 MG TBPK tablet;  Take as directed by package insert. -     fluticasone (FLONASE) 50 MCG/ACT nasal spray; Place 2 sprays into both nostrils daily.  Controlled type 2 diabetes mellitus without complication, without long-term current use of insulin (HCC)  Left ear pain -     methylPREDNISolone (MEDROL DOSEPAK) 4 MG TBPK tablet; Take as directed by package insert. -     fluticasone (FLONASE) 50 MCG/ACT nasal spray; Place 2 sprays into both nostrils daily.  Diarrhea, unspecified type  Dizziness  BPPV (benign paroxysmal positional vertigo), right -     methylPREDNISolone (MEDROL DOSEPAK) 4 MG TBPK tablet; Take as directed by package insert. -     fluticasone (FLONASE) 50 MCG/ACT nasal spray; Place 2 sprays into both nostrils daily.  Tongue irritation -     methylPREDNISolone (MEDROL DOSEPAK) 4 MG TBPK tablet; Take as directed by package insert. -     cetirizine (ZYRTEC) 10 MG tablet; Take 1 tablet (10 mg total) by mouth daily. -     fluticasone (FLONASE) 50 MCG/ACT nasal spray; Place 2 sprays into both nostrils daily.  Right ear impacted cerumen   Right ear cerumen removal.  She is having some vertigo. Positive dix hallpike to the right could be from cerumen removal. Medrol dose pack, flonase, zyrtec started.   I feel like some of her symptoms today could be associated with allergies. No known trigger. A little concerned with tongue irritation. Keep benadryl handy. No throat or lip swelling at this point if she has any take benadryl and seek emergency medical help. Start daily flonase and zyrtec.   Rest and hydrate. Follow up if not improving.   [name].Cerumen Removal Template: Indication: Cerumen impaction of the ear(s) Medical necessity statement: On physical examination, cerumen impairs clinically significant portions of the external auditory canal, and tympanic membrane. Noted obstructive, copious cerumen that cannot be removed without magnification and instrumentations requiring physician  skills Consent: Discussed benefits and risks of procedure and verbal consent obtained Procedure: Patient was prepped for the procedure. Utilized an otoscope to assess and take note of the ear canal, the tympanic membrane, and the presence, amount, and placement of the cerumen. Gentle water irrigation and soft plastic curette was utilized to remove cerumen.  Post procedure examination shows cerumen was completely removed. Patient tolerated procedure well. The patient is made aware that they may experience temporary vertigo, temporary hearing loss, and temporary discomfort. If these symptom last for more than 24 hours to call the clinic or proceed to the ED.  ..I, Iran Planas PA-C, have reviewed and agree with the above documentation in it's entirety.

## 2018-09-05 NOTE — Patient Instructions (Signed)
Vertigo    Vertigo means that you feel like you are moving when you are not. Vertigo can also make you feel like things around you are moving when they are not. This feeling can come and go at any time. Vertigo often goes away on its own.  Follow these instructions at home:  · Avoid making fast movements.  · Avoid driving.  · Avoid using heavy machinery.  · Avoid doing any task or activity that might cause danger to you or other people if you would have a vertigo attack while you are doing it.  · Sit down right away if you feel dizzy or have trouble with your balance.  · Take over-the-counter and prescription medicines only as told by your doctor.  · Follow instructions from your doctor about which positions or movements you should avoid.  · Drink enough fluid to keep your pee (urine) clear or pale yellow.  · Keep all follow-up visits as told by your doctor. This is important.  Contact a doctor if:  · Medicine does not help your vertigo.  · You have a fever.  · Your problems get worse or you have new symptoms.  · Your family or friends see changes in your behavior.  · You feel sick to your stomach (nauseous) or you throw up (vomit).  · You have a “pins and needles” feeling or you are numb in part of your body.  Get help right away if:  · You have trouble moving or talking.  · You are always dizzy.  · You pass out (faint).  · You get very bad headaches.  · You feel weak or have trouble using your hands, arms, or legs.  · You have changes in your hearing.  · You have changes in your seeing (vision).  · You get a stiff neck.  · Bright light starts to bother you.  This information is not intended to replace advice given to you by your health care provider. Make sure you discuss any questions you have with your health care provider.  Document Released: 04/10/2008 Document Revised: 12/08/2015 Document Reviewed: 10/25/2014  Elsevier Interactive Patient Education © 2019 Elsevier Inc.

## 2018-09-11 ENCOUNTER — Telehealth: Payer: Self-pay | Admitting: Internal Medicine

## 2018-09-11 NOTE — Telephone Encounter (Signed)
Patient will like to come to [facility] for her colonoscopy. Patient last colon was in 2015 records will be placed on the DOD Dr. Henrene Pastor for review.

## 2018-09-30 ENCOUNTER — Telehealth: Payer: Self-pay | Admitting: Internal Medicine

## 2018-09-30 NOTE — Telephone Encounter (Signed)
Outside records reviewed.  Colonoscopy August 12, 2013 with Dr. Shana Chute in [facility].  Complete exam.  Tubular adenoma and hyperplastic polyp.  Follow-up colonoscopy at this time is appropriate. Okay to set up colonoscopy in the [place] with Dr. Henrene Pastor. Reason: History of adenomatous colon polyps.

## 2018-10-28 ENCOUNTER — Ambulatory Visit: Payer: Self-pay | Admitting: Physician Assistant

## 2018-12-11 ENCOUNTER — Other Ambulatory Visit: Payer: Self-pay | Admitting: Physician Assistant

## 2018-12-11 DIAGNOSIS — E1169 Type 2 diabetes mellitus with other specified complication: Secondary | ICD-10-CM

## 2018-12-12 ENCOUNTER — Ambulatory Visit (INDEPENDENT_AMBULATORY_CARE_PROVIDER_SITE_OTHER): Payer: BLUE CROSS/BLUE SHIELD | Admitting: Physician Assistant

## 2018-12-12 ENCOUNTER — Encounter: Payer: Self-pay | Admitting: Physician Assistant

## 2018-12-12 VITALS — Ht 62.0 in | Wt 216.0 lb

## 2018-12-12 DIAGNOSIS — E1169 Type 2 diabetes mellitus with other specified complication: Secondary | ICD-10-CM

## 2018-12-12 MED ORDER — EMPAGLIFLOZIN-METFORMIN HCL ER 10-1000 MG PO TB24: 1.0000 | ORAL_TABLET | Freq: Every day | ORAL | 0 refills | Status: DC

## 2018-12-12 NOTE — Progress Notes (Signed)
Patient ID: Christina King, female   DOB: 09/30/1960, 58 y.o.   MRN: [id number] .Christina KitchenVirtual Visit via Telephone Note  I connected with Christina King on 12/12/18 at  3:00 PM EDT by telephone and verified that I am speaking with the correct person using two identifiers.  Location: Patient: home Provider: clinic   I discussed the limitations, risks, security and privacy concerns of performing an evaluation and management service by telephone and the availability of in person appointments. I also discussed with the patient that there may be a patient responsible charge related to this service. The patient expressed understanding and agreed to proceed.   History of Present Illness: Pt is a 58 yo female with T2DM, HLD who calls into the clinic for 3 month follow up.   She has no concerns or complaints. She is not checking sugars. She is taking synjardy every day. Denies any hypoglycemia. No open sores or wounds. She is keeping to a diabetic diet most of the time. She is not exercising.   .. Active Ambulatory Problems    Diagnosis Date Noted  . Hyperlipidemia 02/24/2014  . Dependence on nicotine from other tobacco product 02/24/2014  . Tobacco abuse 02/24/2014  . Personal history of colonic polyps 02/24/2014  . Adjustment disorder with anxiety 03/16/2014  . Vitreous floaters of both eyes 06/15/2015  . Vitamin D deficiency 06/16/2015  . Controlled type 2 diabetes mellitus without complication, without long-term current use of insulin ([place]) 06/17/2015  . Idiopathic peripheral neuropathy 06/17/2015  . Alopecia areata 06/20/2015  . Type 2 diabetes mellitus without complication, without long-term current use of insulin ([place]) 06/20/2015  . Morbid obesity ([place]) 09/28/2015  . MDD (major depressive disorder), recurrent episode, moderate ([place]) 01/02/2016  . Presbyopia 03/07/2016  . Myopia of right eye 03/07/2016  . 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 Regular  astigmatism of left eye 03/07/2016  . Grief reaction 07/29/2018  . Hyperlipidemia associated with type 2 diabetes mellitus ([place]) 07/29/2018  . Adenomatous colon polyp 08/13/2018  . Dizziness 09/05/2018  . BPPV (benign paroxysmal positional vertigo), right 09/05/2018  . ETD (Eustachian tube dysfunction), left 09/05/2018  . Diarrhea 09/05/2018  . Left ear pain 09/05/2018  . Tongue irritation 09/05/2018  . Right ear impacted cerumen 09/05/2018   Resolved Ambulatory Problems    Diagnosis Date Noted  . Obesity, unspecified 03/16/2014  . Obesity (BMI 30-39.9) 04/17/2016   No Additional Past Medical History   Reviewed med, allergy, problem list.    Observations/Objective: No acute distress. Normal mood.  Normal breathing.   .. Today's Vitals   12/12/18 1426  Weight: 216 lb (98 kg)  Height: 5\' 2"  (1.575 m)   Body mass index is 39.51 kg/m.   Assessment and Plan: Christina KitchenMarland KitchenAntroinette was seen today for diabetes.  Diagnoses and all orders for this visit:  Controlled type 2 diabetes mellitus with other specified complication, without long-term current use of insulin (HCC) -     Empagliflozin-metFORMIN HCl ER (SYNJARDY XR) 04-999 MG TB24; Take 1 tablet by mouth daily.   Last A!C was 6.0. pt is not checking sugars but made no changes and taking medication daily.  Refilled for 3 months.  On statin.   Follow Up Instructions:    I discussed the assessment and treatment plan with the patient. The patient was provided an opportunity to ask questions and all were answered. The patient agreed with the plan and demonstrated an understanding of the instructions.   The patient was advised to  call back or seek an in-person evaluation if the symptoms worsen or if the condition fails to improve as anticipated.  I provided 7 minutes of non-face-to-face time during this encounter.   Christina Planas, PA-C

## 2018-12-12 NOTE — Progress Notes (Deleted)
Patient doing well. No complaints. Does not check blood sugars.

## 2019-01-19 ENCOUNTER — Encounter: Payer: Self-pay | Admitting: Physician Assistant

## 2019-03-08 ENCOUNTER — Other Ambulatory Visit: Payer: Self-pay | Admitting: Physician Assistant

## 2019-04-20 LAB — HM DIABETES EYE EXAM

## 2019-05-28 ENCOUNTER — Encounter: Payer: Self-pay | Admitting: Physician Assistant

## 2019-10-19 ENCOUNTER — Ambulatory Visit: Payer: BC Managed Care – PPO | Attending: Internal Medicine

## 2019-10-19 DIAGNOSIS — Z23 Encounter for immunization: Secondary | ICD-10-CM

## 2019-10-19 NOTE — Progress Notes (Signed)
   Covid-19 Vaccination Clinic  Name:  Christina King    MRN: [id number] DOB: [birth year]  10/19/2019  Ms. [name] was observed post Covid-19 immunization for 15 minutes without incident. She was provided with Vaccine Information Sheet and instruction to access the V-Safe system.   Ms. [name] was instructed to call 911 with any severe reactions post vaccine: [name] Difficulty breathing  . Swelling of face and throat  . A fast heartbeat  . A bad rash all over body  . Dizziness and weakness   Immunizations Administered    Name Date Dose VIS Date Route   Pfizer COVID-19 Vaccine 10/19/2019 12:54 PM 0.3 mL 06/26/2019 Intramuscular   Manufacturer: [place]   Lot: [id number]   [place]: [id number]

## 2019-11-10 ENCOUNTER — Ambulatory Visit: Payer: BC Managed Care – PPO | Attending: Internal Medicine

## 2019-11-10 DIAGNOSIS — Z23 Encounter for immunization: Secondary | ICD-10-CM

## 2019-11-10 NOTE — Progress Notes (Signed)
   Covid-19 Vaccination Clinic  Name:  Christina King    MRN: [id number] DOB: Dec 13, 1960  11/10/2019  [name] was observed post Covid-19 immunization for 15 minutes   without incident. She was provided with Vaccine Information Sheet and instruction to access the V-Safe system.   [name] was instructed to call 911 with any severe reactions post vaccine: [name] Difficulty breathing  . Swelling of face and throat  . A fast heartbeat  . A bad rash all over body  . Dizziness and weakness   Immunizations Administered    Name Date Dose VIS Date Route   Pfizer COVID-19 Vaccine 11/10/2019 12:29 PM 0.3 mL 09/09/2018 Intramuscular   Manufacturer: [place]   Lot: [id number]   [place]: [id number]

## 2019-11-15 IMAGING — MG DIGITAL DIAGNOSTIC UNILATERAL RIGHT MAMMOGRAM WITH TOMO AND CAD
6 series · 6 of 18 positions shown · non-contrast
Comparison: Previous exam(s).

CLINICAL DATA: The patient was called back for a right breast mass.

EXAM:
DIGITAL DIAGNOSTIC RIGHT MAMMOGRAM WITH CAD AND TOMO
ULTRASOUND RIGHT BREAST

[R MLO synth-2D]
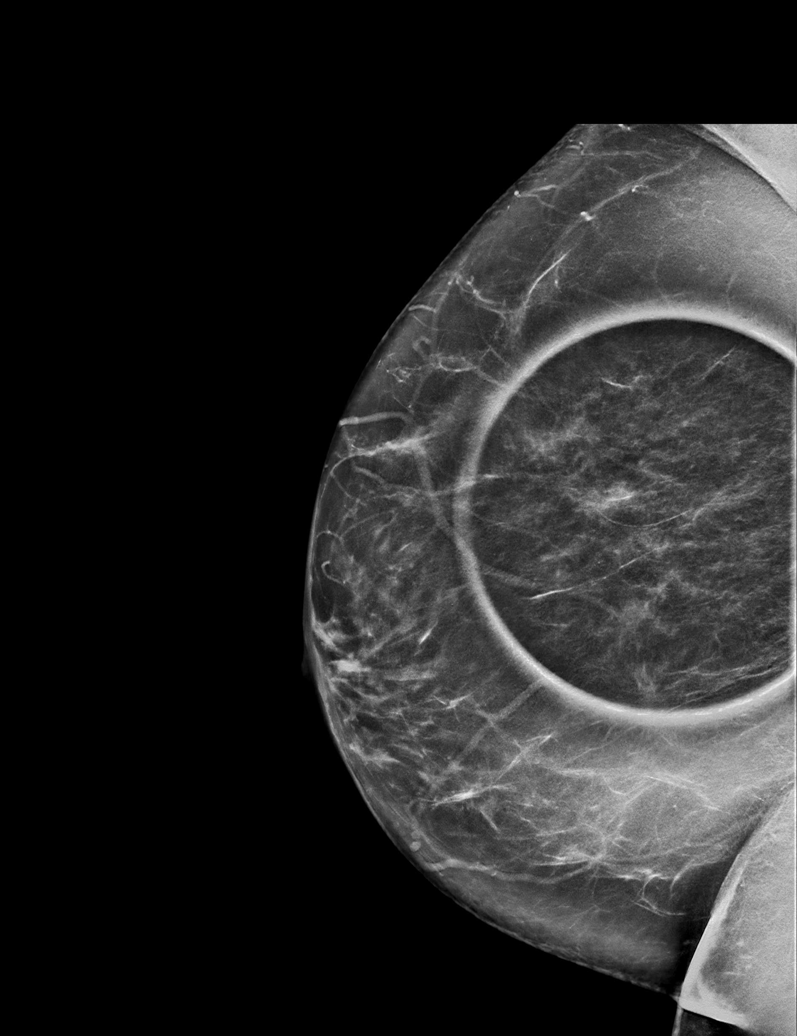

[R CC synth-2D (1 of 2)]
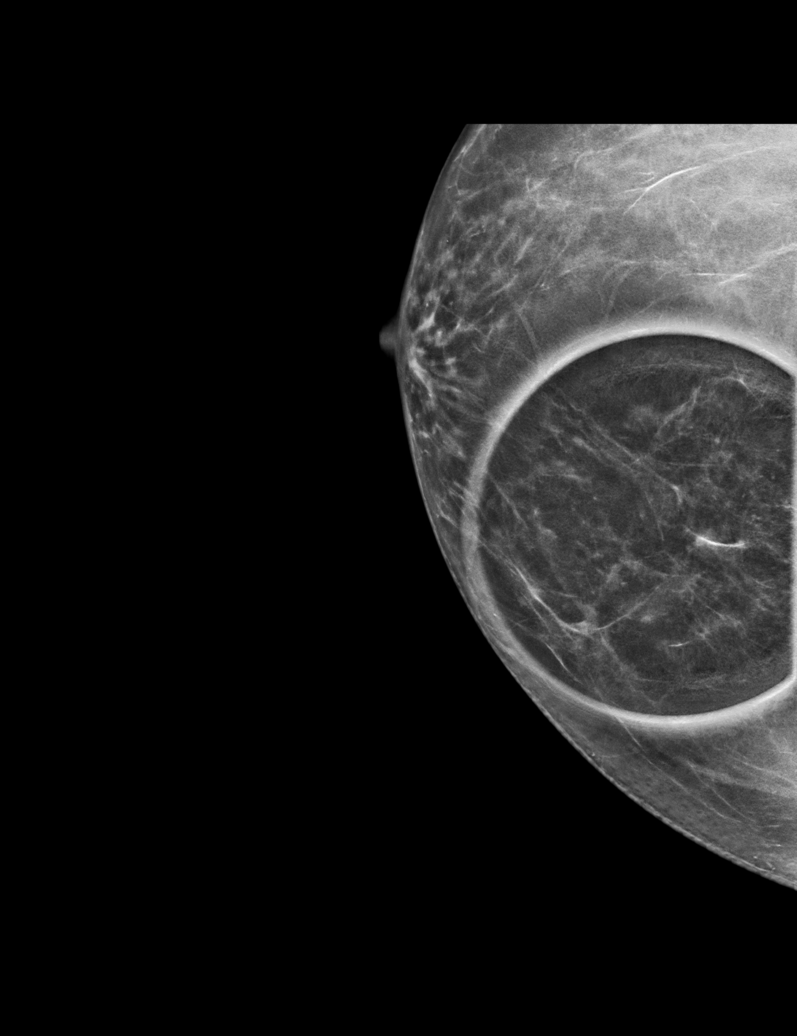

[R CC synth-2D (2 of 2)]
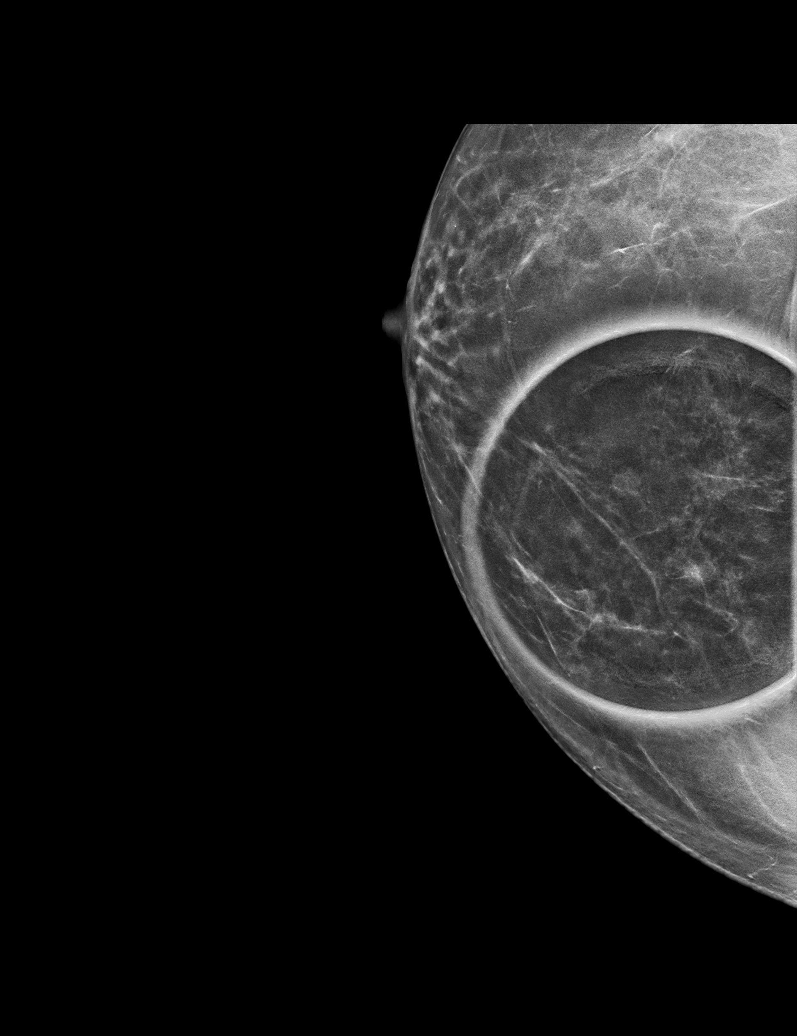

[R CC tomo (1 of 2) · tomo slice 31/61.0]
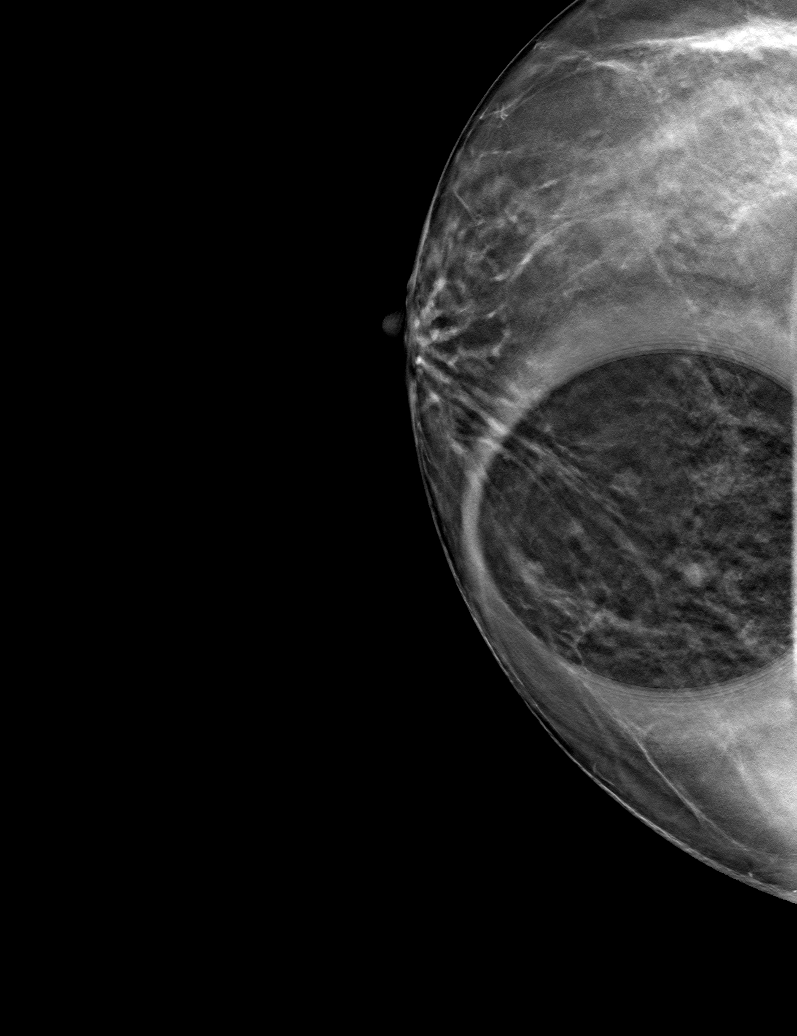

[R CC tomo (2 of 2) · tomo slice 31/60.0]
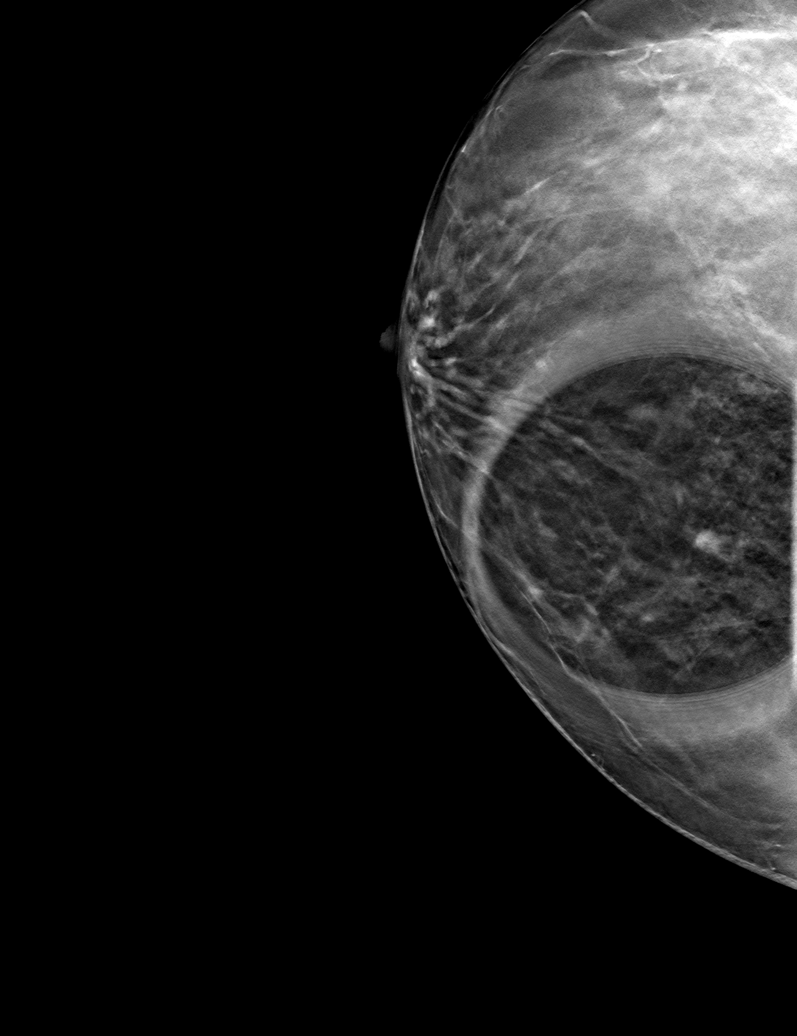

[R MLO tomo · tomo slice 37/74.0]
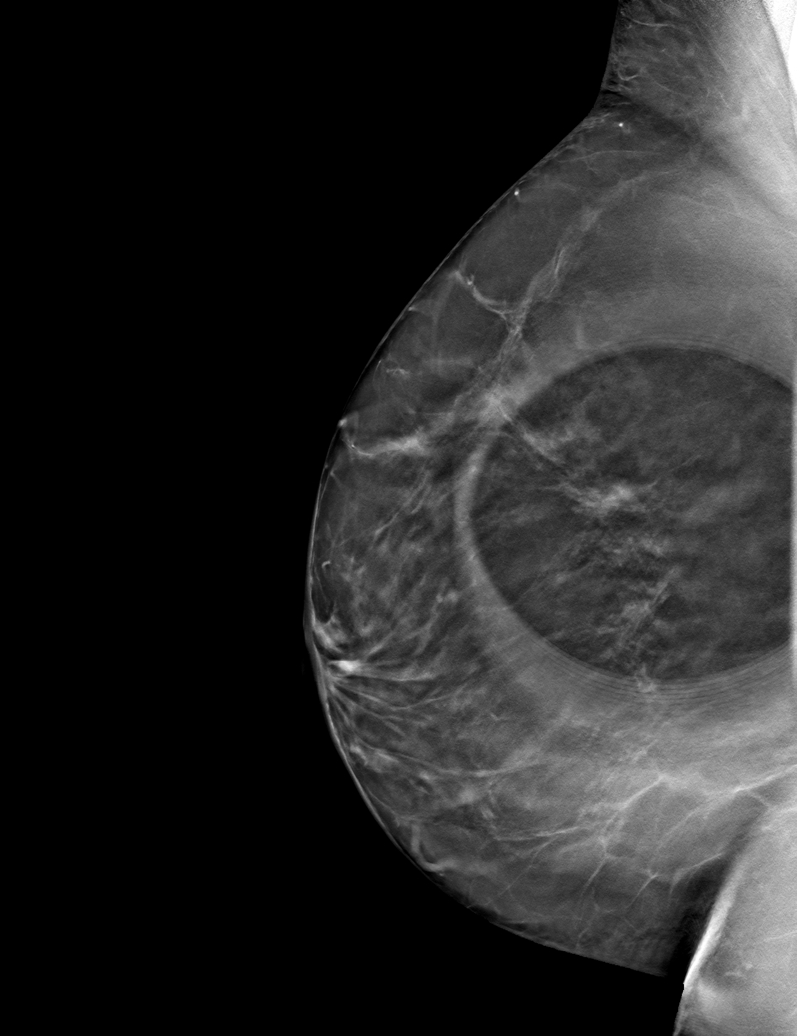

[6 of 18 positions shown; findings below may reference images not displayed]

ACR Breast Density Category b: There are scattered areas of
fibroglandular density.
FINDINGS: The right breast mass in the medial central to the slightly upper
right breast persists on additional imaging. It measures 5 mm.

Mammographic images were processed with CAD.

On physical exam, no suspicious lumps are identified.

Targeted ultrasound is performed, showing an irregular hypoechoic
mass in the right breast at 1 o'clock, 4 cm from the nipple
measuring 6 by 5 x 4 mm. No axillary adenopathy.
IMPRESSION: There is an irregular hypoechoic mass in the right breast at 1
o'clock, 4 cm from the nipple which may correlate with the
mammographic finding.

RECOMMENDATION:
Recommend biopsying the mass in the right breast at 1 o'clock.
Recommend clip placement with follow-up mammography to ensure the
mammographic and sonographic findings correlate. If the biopsy is
benign but the sonographic finding does not correlate with the
mammographic finding, recommend a six-month follow-up of the
mammographically identified mass.

I have discussed the findings and recommendations with the patient.
Results were also provided in writing at the conclusion of the
visit. If applicable, a reminder letter will be sent to the patient
regarding the next appointment.

BI-RADS CATEGORY  4: Suspicious.

## 2019-11-19 ENCOUNTER — Encounter: Payer: Self-pay | Admitting: Nurse Practitioner

## 2019-11-19 ENCOUNTER — Other Ambulatory Visit: Payer: Self-pay

## 2019-11-19 ENCOUNTER — Ambulatory Visit (INDEPENDENT_AMBULATORY_CARE_PROVIDER_SITE_OTHER): Payer: BC Managed Care – PPO | Admitting: Nurse Practitioner

## 2019-11-19 VITALS — BP 153/85 | HR 77 | Ht 62.0 in | Wt 234.0 lb

## 2019-11-19 DIAGNOSIS — E1169 Type 2 diabetes mellitus with other specified complication: Secondary | ICD-10-CM

## 2019-11-19 DIAGNOSIS — E785 Hyperlipidemia, unspecified: Secondary | ICD-10-CM | POA: Diagnosis not present

## 2019-11-19 DIAGNOSIS — R030 Elevated blood-pressure reading, without diagnosis of hypertension: Secondary | ICD-10-CM

## 2019-11-19 DIAGNOSIS — R600 Localized edema: Secondary | ICD-10-CM

## 2019-11-19 DIAGNOSIS — F331 Major depressive disorder, recurrent, moderate: Secondary | ICD-10-CM

## 2019-11-19 LAB — COMPLETE METABOLIC PANEL WITH GFR
AG Ratio: 1.7 (calc) (ref 1.0–2.5)
ALT: 33 U/L — ABNORMAL HIGH (ref 6–29)
AST: 27 U/L (ref 10–35)
Albumin: 4.6 g/dL (ref 3.6–5.1)
Alkaline phosphatase (APISO): 85 U/L (ref 37–153)
BUN: 8 mg/dL (ref 7–25)
CO2: 27 mmol/L (ref 20–32)
Calcium: 10.1 mg/dL (ref 8.6–10.4)
Chloride: 103 mmol/L (ref 98–110)
Creat: 0.75 mg/dL (ref 0.50–1.05)
GFR, Est African American: 101 mL/min/{1.73_m2} (ref >=60)
GFR, Est Non African American: 87 mL/min/{1.73_m2} (ref >=60)
Globulin: 2.7 g/dL (calc) (ref 1.9–3.7)
Glucose, Bld: 122 mg/dL — ABNORMAL HIGH (ref 65–99)
Potassium: 4.6 mmol/L (ref 3.5–5.3)
Sodium: 139 mmol/L (ref 135–146)
Total Bilirubin: 0.3 mg/dL (ref 0.2–1.2)
Total Protein: 7.3 g/dL (ref 6.1–8.1)

## 2019-11-19 LAB — POCT UA - MICROALBUMIN
Albumin/Creatinine Ratio, Urine, POC: <30
Creatinine, POC: 200 mg/dL
Microalbumin Ur, POC: 10 mg/L

## 2019-11-19 LAB — CBC
HCT: 43.3 % (ref 35.0–45.0)
Hemoglobin: 14.8 g/dL (ref 11.7–15.5)
MCH: 31.5 pg (ref 27.0–33.0)
MCHC: 34.2 g/dL (ref 32.0–36.0)
MCV: 92.1 fL (ref 80.0–100.0)
MPV: 10.1 fL (ref 7.5–12.5)
Platelets: 260 10*3/uL (ref 140–400)
RBC: 4.7 10*6/uL (ref 3.80–5.10)
RDW: 13 % (ref 11.0–15.0)
WBC: 4.2 10*3/uL (ref 3.8–10.8)

## 2019-11-19 LAB — POCT GLYCOSYLATED HEMOGLOBIN (HGB A1C): Hemoglobin A1C: 6.7 % — AB (ref 4.0–5.6)

## 2019-11-19 MED ORDER — ESCITALOPRAM OXALATE 20 MG PO TABS: ORAL_TABLET | ORAL | 1 refills | Status: DC

## 2019-11-19 MED ORDER — ATORVASTATIN CALCIUM 40 MG PO TABS: 40.0000 mg | ORAL_TABLET | Freq: Every day | ORAL | 3 refills | Status: AC

## 2019-11-19 MED ORDER — SYNJARDY XR 10-1000 MG PO TB24: 1.0000 | ORAL_TABLET | Freq: Every day | ORAL | 0 refills | Status: DC

## 2019-11-19 NOTE — Patient Instructions (Addendum)
I would like you to try compression stockings to wear during the day. These can be purchased at the store or online without a prescription. I would also like you to keep your feet elevated when you are seated to help with the return of blood to your heart.   When you come back, we can look at if you may need a medication to help better control your blood pressure, but I would like to let the medication for your mood start working before we make that change.   Edema  Edema is when you have too much fluid in your body or under your skin. Edema may make your legs, feet, and ankles swell up. Swelling is also common in looser tissues, like around your eyes. This is a common condition. It gets more common as you get older. There are many possible causes of edema. Eating too much salt (sodium) and being on your feet or sitting for a long time can cause edema in your legs, feet, and ankles. Hot weather may make edema worse. Edema is usually painless. Your skin may look swollen or shiny. Follow these instructions at home:  Keep the swollen body part raised (elevated) above the level of your heart when you are sitting or lying down.  Do not sit still or stand for a long time.  Do not wear tight clothes. Do not wear garters on your upper legs.  Exercise your legs. This can help the swelling go down.  Wear elastic bandages or support stockings as told by your doctor.  Eat a low-salt (low-sodium) diet to reduce fluid as told by your doctor.  Depending on the cause of your swelling, you may need to limit how much fluid you drink (fluid restriction).  Take over-the-counter and prescription medicines only as told by your doctor. Contact a doctor if:  Treatment is not working.  You have heart, liver, or kidney disease and have symptoms of edema.  You have sudden and unexplained weight gain. Get help right away if:  You have shortness of breath or chest pain.  You cannot breathe when you lie  down.  You have pain, redness, or warmth in the swollen areas.  You have heart, liver, or kidney disease and get edema all of a sudden.  You have a fever and your symptoms get worse all of a sudden. Summary  Edema is when you have too much fluid in your body or under your skin.  Edema may make your legs, feet, and ankles swell up. Swelling is also common in looser tissues, like around your eyes.  Raise (elevate) the swollen body part above the level of your heart when you are sitting or lying down.  Follow your doctor's instructions about diet and how much fluid you can drink (fluid restriction). This information is not intended to replace advice given to you by your health care provider. Make sure you discuss any questions you have with your health care provider. Document Revised: 07/05/2017 Document Reviewed: 07/20/2016 Elsevier Patient Education  Christina King.    Major Depressive Disorder, Adult Major depressive disorder (MDD) is a mental health condition. MDD often makes you feel sad, hopeless, or helpless. MDD can also cause symptoms in your body. MDD can affect your:  Work.  School.  Relationships.  Other normal activities. MDD can range from mild to very bad. It may occur once (single episode MDD). It can also occur many times (recurrent MDD). The main symptoms of MDD often include:  Feeling sad, depressed, or irritable most of the time.  Loss of interest. MDD symptoms also include:  Sleeping too much or too little.  Eating too much or too little.  A change in your weight.  Feeling tired (fatigue) or having low energy.  Feeling worthless.  Feeling guilty.  Trouble making decisions.  Trouble thinking clearly.  Thoughts of suicide or harming others.  Feeling weak.  Feeling agitated.  Keeping yourself from being around other people (isolation). Follow these instructions at home: Activity  Do these things as told by your doctor: ? Go back to  your normal activities. ? Exercise regularly. ? Spend time outdoors. Alcohol  Talk with your doctor about how alcohol can affect your antidepressant medicines.  Do not drink alcohol. Or, limit how much alcohol you drink. ? This means no more than 1 drink a day for nonpregnant women and 2 drinks a day for men. One drink equals one of these:  12 oz of beer.  5 oz of wine.  1 oz of hard liquor. General instructions  Take over-the-counter and prescription medicines only as told by your doctor.  Eat a healthy diet.  Get plenty of sleep.  Find activities that you enjoy. Make time to do them.  Think about joining a support group. Your doctor may be able to suggest a group for you.  Keep all follow-up visits as told by your doctor. This is important. Where to find more information:  [employer] on Mental Illness: ? www.nami.[place]: ? https://carter.com/  National Suicide Prevention Lifeline: ? 971-297-4463. This is free, 24-hour help. Contact a doctor if:  Your symptoms get worse.  You have new symptoms. Get help right away if:  You self-harm.  You see, hear, taste, smell, or feel things that are not present (hallucinate). If you ever feel like you may hurt yourself or others, or have thoughts about taking your own life, get help right away. You can go to your nearest emergency department or call:  Your local emergency services (911 in the [place]  A suicide crisis helpline, such as the National Suicide Prevention Lifeline: ? 443-328-8478. This is open 24 hours a day. This information is not intended to replace advice given to you by your health care provider. Make sure you discuss any questions you have with your health care provider. Document Revised: 06/14/2017 Document Reviewed: 03/18/2016   Generalized Anxiety Disorder, Adult Generalized anxiety disorder (GAD) is a mental health disorder. People with this condition  constantly worry about everyday events. Unlike normal anxiety, worry related to GAD is not triggered by a specific event. These worries also do not fade or get better with time. GAD interferes with life functions, including relationships, work, and school. GAD can vary from mild to severe. People with severe GAD can have intense waves of anxiety with physical symptoms (panic attacks). What are the causes? The exact cause of GAD is not known. What increases the risk? This condition is more likely to develop in:  Women.  People who have a family history of anxiety disorders.  People who are very shy.  People who experience very stressful life events, such as the death of a loved one.  People who have a very stressful family environment. What are the signs or symptoms? People with GAD often worry excessively about many things in their lives, such as their health and family. They may also be overly concerned about:  Doing well at work.  Being  on time.  Natural disasters.  Friendships. Physical symptoms of GAD include:  Fatigue.  Muscle tension or having muscle twitches.  Trembling or feeling shaky.  Being easily startled.  Feeling like your heart is pounding or racing.  Feeling out of breath or like you cannot take a deep breath.  Having trouble falling asleep or staying asleep.  Sweating.  Nausea, diarrhea, or irritable bowel syndrome (IBS).  Headaches.  Trouble concentrating or remembering facts.  Restlessness.  Irritability. How is this diagnosed? Your health care provider can diagnose GAD based on your symptoms and medical history. You will also have a physical exam. The health care provider will ask specific questions about your symptoms, including how severe they are, when they started, and if they come and go. Your health care provider may ask you about your use of alcohol or drugs, including prescription medicines. Your health care provider may refer you to a  mental health specialist for further evaluation. Your health care provider will do a thorough examination and may perform additional tests to rule out other possible causes of your symptoms. To be diagnosed with GAD, a person must have anxiety that:  Is out of his or her control.  Affects several different aspects of his or her life, such as work and relationships.  Causes distress that makes him or her unable to take part in normal activities.  Includes at least three physical symptoms of GAD, such as restlessness, fatigue, trouble concentrating, irritability, muscle tension, or sleep problems. Before your health care provider can confirm a diagnosis of GAD, these symptoms must be present more days than they are not, and they must last for six months or longer. How is this treated? The following therapies are usually used to treat GAD:  Medicine. Antidepressant medicine is usually prescribed for long-term daily control. Antianxiety medicines may be added in severe cases, especially when panic attacks occur.  Talk therapy (psychotherapy). Certain types of talk therapy can be helpful in treating GAD by providing support, education, and guidance. Options include: ? Cognitive behavioral therapy (CBT). People learn coping skills and techniques to ease their anxiety. They learn to identify unrealistic or negative thoughts and behaviors and to replace them with positive ones. ? Acceptance and commitment therapy (ACT). This treatment teaches people how to be mindful as a way to cope with unwanted thoughts and feelings. ? Biofeedback. This process trains you to manage your body's response (physiological response) through breathing techniques and relaxation methods. You will work with a therapist while machines are used to monitor your physical symptoms.  Stress management techniques. These include yoga, meditation, and exercise. A mental health specialist can help determine which treatment is best for  you. Some people see improvement with one type of therapy. However, other people require a combination of therapies. Follow these instructions at home:  Take over-the-counter and prescription medicines only as told by your health care provider.  Try to maintain a normal routine.  Try to anticipate stressful situations and allow extra time to manage them.  Practice any stress management or self-calming techniques as taught by your health care provider.  Do not punish yourself for setbacks or for not making progress.  Try to recognize your accomplishments, even if they are small.  Keep all follow-up visits as told by your health care provider. This is important. Contact a health care provider if:  Your symptoms do not get better.  Your symptoms get worse.  You have signs of depression, such as: ? A  persistently sad, cranky, or irritable mood. ? Loss of enjoyment in activities that used to bring you joy. ? Change in weight or eating. ? Changes in sleeping habits. ? Avoiding friends or family members. ? Loss of energy for normal tasks. ? Feelings of guilt or worthlessness. Get help right away if:  You have serious thoughts about hurting yourself or others. If you ever feel like you may hurt yourself or others, or have thoughts about taking your own life, get help right away. You can go to your nearest emergency department or call:  Your local emergency services (911 in the [place]  A suicide crisis helpline, such as the [place] at (631)032-7232. This is open 24 hours a day. Summary  Generalized anxiety disorder (GAD) is a mental health disorder that involves worry that is not triggered by a specific event.  People with GAD often worry excessively about many things in their lives, such as their health and family.  GAD may cause physical symptoms such as restlessness, trouble concentrating, sleep problems, frequent sweating, nausea, diarrhea,  headaches, and trembling or muscle twitching.  A mental health specialist can help determine which treatment is best for you. Some people see improvement with one type of therapy. However, other people require a combination of therapies. This information is not intended to replace advice given to you by your health care provider. Make sure you discuss any questions you have with your health care provider. Document Revised: 06/14/2017 Document Reviewed: 05/22/2016 Elsevier Patient Education  2020 [place] Patient Education  [employer].

## 2019-11-19 NOTE — Progress Notes (Signed)
Acute Office Visit  Subjective:    Patient ID: Christina King, female    DOB: [birth year], 59 y.o.   MRN: [id number]  Chief Complaint  Patient presents with  . Edema    HPI Patient is in today for swelling in her feet and also to discuss depression and anxiety symptoms.  LOWER EXTREMITY EDEMA Patient reports slow onset of edema bilaterally in her lower extremities with the left slightly worse than the right that started approximately 1 week after her first Covid vaccine.  She was unsure if this was related.  She did begin to watch her sodium intake closely and increase her fluids and recognized that the symptoms started to dissipate.  After her second Covid vaccine she began to experience the edema once again and it has not subsided.  It has been present for several weeks now.  She denies any redness, sensation changes, numbness, tingling, pain in her calves, pain with walking.  She does report she sits all day at her job and does not get much exercise at this time.  She does report the edema is gone in the mornings when she wakes up and is worse at night before going to bed.  DIABETES AND HYPERLIPIDEMIA She reports she stopped taking her Synjardy and Lipitor approximately 3 months ago.  She has been watching her diet and avoids unnecessary sugar and fat intake.  She reports she has been under extreme amount of stress lately and just did not pick up the medications and then was concerned to restart them without first discussing with her healthcare provider.  DEPRESSION She reports she has been under significant amount of stress lately.  Her work is increasingly demanding with Covid and multiple people being out of the office.  She feels like she is underappreciated and overworked.  She is also under significant amount of stress caring for her older and parents.  She reports they have a significant dependence on her and she feels like she never really gets a break.  Her mom does have a  terminal diagnosis which is new and this has also added a significant burden to her.  She also had a sister who passed away in 07/29/2018 and a cousin who was murdered and these both way on her mind quite a lot.  She has undergone therapy in the past, but does not feel this was very helpful.  She is open to this option but would like a different counselor if that is the route that we go.    Past Medical History:  Diagnosis Date  . Hyperlipidemia     Past Surgical History:  Procedure Laterality Date  . ABDOMINAL HYSTERECTOMY    . TUBAL LIGATION      Family History  Problem Relation Age of Onset  . Diabetes Mother   . Hypertension Mother   . Diabetes Father   . Hyperlipidemia Father   . Hypertension Father   . Diabetes Sister   . Depression Brother   . Cancer Paternal Aunt        esophageal    Social History   Socioeconomic History  . Marital status: Married    Spouse name: Not on file  . Number of children: Not on file  . Years of education: Not on file  . Highest education level: Not on file  Occupational History  . Not on file  Tobacco Use  . Smoking status: Former Smoker    Types: Cigarettes    Quit date: 06/20/2014  Years since quitting: 5.4  . Smokeless tobacco: Never Used  Substance and Sexual Activity  . Alcohol use: Yes    Alcohol/week: 0.0 standard drinks  . Drug use: No  . Sexual activity: Never  Other Topics Concern  . Not on file  Social History Narrative  . Not on file   Social Determinants of Health   Financial Resource Strain:   . Difficulty of Paying Living Expenses:   Food Insecurity:   . Worried About Charity fundraiser in the Last Year:   . Arboriculturist in the Last Year:   Transportation Needs:   . Film/video editor (Medical):   [name] Lack of Transportation (Non-Medical):   Physical Activity:   . Days of Exercise per Week:   . Minutes of Exercise per Session:   Stress:   . Feeling of Stress :   Social Connections:   .  Frequency of Communication with Friends and Family:   . Frequency of Social Gatherings with Friends and Family:   . Attends Religious Services:   . Active Member of Clubs or Organizations:   . Attends Archivist Meetings:   [name] Marital Status:   Intimate Partner Violence:   . Fear of Current or Ex-Partner:   . Emotionally Abused:   [name] Physically Abused:   . Sexually Abused:     Outpatient Medications Prior to Visit  Medication Sig Dispense Refill  . atorvastatin (LIPITOR) 40 MG tablet TAKE 1 TABLET BY MOUTH EVERY DAY (Patient not taking: Reported on 11/19/2019) 90 tablet 3  . Empagliflozin-metFORMIN HCl ER (SYNJARDY XR) 04-999 MG TB24 Take 1 tablet by mouth daily. (Patient not taking: Reported on 11/19/2019) 90 tablet 0   No facility-administered medications prior to visit.    Allergies  Allergen Reactions  . Darvon [Propoxyphene] Itching    Darvocet    Review of Systems  Constitutional: Positive for fatigue. Negative for activity change, appetite change, fever and unexpected weight change.  Respiratory: Negative for cough, chest tightness and shortness of breath.   Cardiovascular: Negative for chest pain, palpitations and leg swelling.  Gastrointestinal: Negative for abdominal distention, abdominal pain, constipation, diarrhea, nausea and vomiting.  Endocrine: Negative for polydipsia, polyphagia and polyuria.  Genitourinary: Negative for dysuria, frequency and urgency.  Musculoskeletal: Negative for arthralgias, joint swelling and myalgias.  Skin: Negative for color change, pallor, rash and wound.  Neurological: Negative for syncope, weakness, numbness and headaches.  Psychiatric/Behavioral: Positive for agitation, decreased concentration, dysphoric mood and sleep disturbance. Negative for confusion, self-injury and suicidal ideas. The patient is nervous/anxious.        Objective:    Physical Exam Vitals and nursing note reviewed.  Constitutional:      Appearance:  Normal appearance.  HENT:     Head: Normocephalic and atraumatic.  Eyes:     Extraocular Movements: Extraocular movements intact.     Conjunctiva/sclera: Conjunctivae normal.     Pupils: Pupils are equal, round, and reactive to light.  Neck:     Vascular: No carotid bruit.  Cardiovascular:     Rate and Rhythm: Normal rate and regular rhythm.     Pulses: Normal pulses.     Heart sounds: Normal heart sounds.  Pulmonary:     Effort: Pulmonary effort is normal.     Breath sounds: Normal breath sounds.  Abdominal:     General: Abdomen is flat. Bowel sounds are normal.     Palpations: Abdomen is soft.  Musculoskeletal:  General: Normal range of motion.     Cervical back: Normal range of motion.     Right lower leg: 1+ Edema present.     Left lower leg: 1+ Edema present.  Lymphadenopathy:     Cervical: No cervical adenopathy.  Skin:    General: Skin is warm and dry.     Capillary Refill: Capillary refill takes less than 2 seconds.  Neurological:     General: No focal deficit present.     Mental Status: She is alert and oriented to person, place, and time.     Motor: No weakness.     Coordination: Coordination normal.     Gait: Gait normal.     Deep Tendon Reflexes: Reflexes normal.  Psychiatric:        Attention and Perception: Attention normal.        Mood and Affect: Mood is depressed. Affect is tearful.        Speech: Speech normal.        Behavior: Behavior normal.        Thought Content: Thought content normal.        Cognition and Memory: Cognition normal.        Judgment: Judgment normal.     BP (!) 153/85   Pulse 77   Ht 5\' 2"  (1.575 m)   Wt 234 lb (106.1 kg)   SpO2 92%   BMI 42.80 kg/m  Wt Readings from Last 3 Encounters:  11/19/19 234 lb (106.1 kg)  12/12/18 216 lb (98 kg)  09/05/18 216 lb (98 kg)    There are no preventive care reminders to display for this patient.  There are no preventive care reminders to display for this patient.   Lab  Results  Component Value Date   TSH 1.19 12/18/2017   Lab Results  Component Value Date   WBC 4.6 06/15/2015   HGB 13.6 06/15/2015   HCT 41.1 06/15/2015   MCV 91.5 06/15/2015   PLT 246 06/15/2015   Lab Results  Component Value Date   NA 142 07/29/2018   K 4.3 07/29/2018   CO2 28 07/29/2018   GLUCOSE 108 (H) 07/29/2018   BUN 11 07/29/2018   CREATININE 0.80 07/29/2018   BILITOT 0.4 07/29/2018   ALKPHOS 72 06/15/2015   AST 27 07/29/2018   ALT 32 (H) 07/29/2018   PROT 6.6 07/29/2018   ALBUMIN 3.9 06/15/2015   CALCIUM 9.4 07/29/2018   Lab Results  Component Value Date   CHOL 159 07/29/2018   Lab Results  Component Value Date   HDL 66 07/29/2018   Lab Results  Component Value Date   LDLCALC 77 07/29/2018   Lab Results  Component Value Date   TRIG 80 07/29/2018   Lab Results  Component Value Date   CHOLHDL 2.4 07/29/2018   Lab Results  Component Value Date   HGBA1C 6.7 (A) 11/19/2019       Assessment & Plan:   1. Controlled type 2 diabetes mellitus with other specified complication, without long-term current use of insulin ([place]) Patient has been off of her medication for approximately 3 months.  Hemoglobin A1c today was 6.7%.  Foot exam performed and within normal limits.  Urine microalbumin was negative. At this time we will plan to restart Synjardy 04-999 milligrams tablet daily.  Patient encouraged to continue a diet low in sodium, cholesterol, and carbohydrates. We will plan to follow-up on diabetes in approximately 6 months after she has been on the medication. - POCT  UA - Microalbumin - POCT glycosylated hemoglobin (Hb A1C) - Empagliflozin-metFORMIN HCl ER (SYNJARDY XR) 04-999 MG TB24; Take 1 tablet by mouth daily.  Dispense: 90 tablet; Refill: 0  2. Hyperlipidemia, unspecified hyperlipidemia type Hyperlipidemia in the setting of type 2 diabetes.  The patient has been off of her medication for approximately 3 months.  We will restart the Lipitor 40 mg  today and plan to recheck labs in approximately 6 months after she has had a chance to be on the medication.  - atorvastatin (LIPITOR) 40 MG tablet; Take 1 tablet (40 mg total) by mouth daily.  Dispense: 90 tablet; Refill: 3  3. MDD (major depressive disorder), recurrent episode, moderate (HCC) Recurrent episode of major depressive disorder escalated with increasing stress at work and home with the care of her parents.  We did discuss the option of starting therapy.  She would like to trial medications at this time but is open to revisit this in approximately 1 month. Prescription for Lexapro 20 mg provided with instructions to take one half tab (10 mg) daily for 1 week then increase to full tab (20 mg) from then on. Plan to follow-up in approximately 4 weeks to determine the effectiveness of the medication.  Patient instructed to contact the office sooner if she begins to experience negative side effects or worsening depression. - escitalopram (LEXAPRO) 20 MG tablet; One half tab (10 mg) daily for a week then one tab (20 mg) by mouth daily  Dispense: 30 tablet; Refill: 1  4. Localized edema Localized lower extremity edema most likely due to venous stasis in the setting of inactivity.  Her blood pressure was elevated in the office today however she is incredibly stressed.  There are no signs or symptoms associated with fluid retention or congestive heart failure at this time.  I would like to see her blood pressure a little better under control but feel that it is more important to get her restarted on her diabetes and cholesterol medications and to get her started on something for her depression. Will obtain labs today to monitor for electrolyte or blood count imbalances. Patient provided with instruction on utilizing compression stockings and keeping her feet elevated when she is in the seated position. Patient instructed to contact the office if she begins to experience a worsening of symptoms,  shortness of breath, chest pain, or pain in her legs. - CBC - COMPLETE METABOLIC PANEL WITH GFR  5. Elevated blood pressure reading in office without diagnosis of hypertension Patient's blood pressure was elevated in the office today.  She does not monitor her blood pressure at home and has never been on medication for her blood pressure.  She is under a significant amount of stress at this time with increasing demands at work and caring for her older parents.  New medications started today but did encourage the patient to follow a low sodium, low carbohydrate, low-fat diet and try to get some exercise to see if this will help decrease her blood pressure.  I am hopeful that restarting her medication for diabetes and cholesterol in addition to managing her depression will help with this. Follow-up in 4 weeks for recheck of blood pressure when she comes in for visit for her depression.   Orma Render, NP

## 2019-11-24 ENCOUNTER — Encounter: Payer: Self-pay | Admitting: Nurse Practitioner

## 2019-11-24 ENCOUNTER — Telehealth: Payer: Self-pay | Admitting: Neurology

## 2019-11-24 NOTE — Telephone Encounter (Signed)
Patient left a vm stating she needed a call back about some equipment for work.   I called patient back. She states having lots of swelling, sits all day at work. Requested a standing desk, but they need [place] to fill out a form stating it is medically necessary. She will send form through mychart for [place] to complete and send back to them. Will call with any other issues.

## 2019-11-30 NOTE — Telephone Encounter (Signed)
No fax available. Forms mailed to:  [employer] [address] [place] [address]  Copy sent to scan.

## 2019-11-30 NOTE — Telephone Encounter (Signed)
Patient left vm asking if we faxed form. She did leave a fax number of [phone]. FAXED to number given with confirmation received. LMOM letting patient know faxed this morning, but this was mailed. To call back with any questions.

## 2019-12-11 ENCOUNTER — Other Ambulatory Visit: Payer: Self-pay | Admitting: Nurse Practitioner

## 2019-12-11 DIAGNOSIS — F331 Major depressive disorder, recurrent, moderate: Secondary | ICD-10-CM

## 2019-12-30 ENCOUNTER — Other Ambulatory Visit: Payer: Self-pay

## 2019-12-30 ENCOUNTER — Ambulatory Visit (INDEPENDENT_AMBULATORY_CARE_PROVIDER_SITE_OTHER): Payer: BC Managed Care – PPO | Admitting: Physician Assistant

## 2019-12-30 ENCOUNTER — Encounter: Payer: Self-pay | Admitting: Physician Assistant

## 2019-12-30 VITALS — BP 148/79 | HR 62 | Ht 62.0 in | Wt 235.0 lb

## 2019-12-30 DIAGNOSIS — F419 Anxiety disorder, unspecified: Secondary | ICD-10-CM

## 2019-12-30 DIAGNOSIS — I10 Essential (primary) hypertension: Secondary | ICD-10-CM

## 2019-12-30 DIAGNOSIS — E119 Type 2 diabetes mellitus without complications: Secondary | ICD-10-CM

## 2019-12-30 DIAGNOSIS — Z1211 Encounter for screening for malignant neoplasm of colon: Secondary | ICD-10-CM | POA: Diagnosis not present

## 2019-12-30 DIAGNOSIS — R600 Localized edema: Secondary | ICD-10-CM | POA: Insufficient documentation

## 2019-12-30 DIAGNOSIS — F331 Major depressive disorder, recurrent, moderate: Secondary | ICD-10-CM

## 2019-12-30 MED ORDER — ESCITALOPRAM OXALATE 5 MG PO TABS: 5.0000 mg | ORAL_TABLET | Freq: Every day | ORAL | 1 refills | Status: DC

## 2019-12-30 MED ORDER — HYDROCHLOROTHIAZIDE 12.5 MG PO TABS: 12.5000 mg | ORAL_TABLET | Freq: Every day | ORAL | 1 refills | Status: DC

## 2019-12-30 NOTE — Progress Notes (Signed)
Subjective:    Patient ID: Christina King, female    DOB: 11-30-1960, 59 y.o.   MRN: [id number]  HPI  Patient is a 59 year old female with type 2 diabetes, hyperlipidemia, anxiety, major depression and bilateral lower extremity edema who presents to the clinic for follow-up.  She was seen approximately 2 months ago by another provider in the office and started on Lexapro for her mood.  She started with a half tab and she has not been able to tolerate it.  She admits to starting it in the morning.  It made her really sleepy and wake too numb.  She has not been taking it.  She continues to have to take care of her parents who are aging.  She is in a bad marriage.  She does not have a lot of people to talk to.  She is not currently in counseling.  She has 1 close friend that she is going through a lot also and feels like she is more there for her.  She denies any suicidal thoughts or homicidal idealizations.  She is not checking her sugars but taking her Synjardy.  Last A1c was controlled.  She denies any concerns or complaints.  Patient is not checking her blood pressure at home.  But it was elevated at last visit.  There was some discussion about a recheck.  She denies any chest pain, shortness of breath, palpitations, headaches, dizziness.  Patient continues to have some intermittent bilateral lower leg edema.  It has improved.  She was not spilling any protein in her kidney function was stable at last visit. She does have compression stockings which help.   .. Active Ambulatory Problems    Diagnosis Date Noted  . Hyperlipidemia 02/24/2014  . Dependence on nicotine from other tobacco product 02/24/2014  . Tobacco abuse 02/24/2014  . Personal history of colonic polyps 02/24/2014  . Adjustment disorder with anxiety 03/16/2014  . Vitreous floaters of both eyes 06/15/2015  . Vitamin D deficiency 06/16/2015  . Controlled type 2 diabetes mellitus without complication, without long-term  current use of insulin ([place]) 06/17/2015  . Idiopathic peripheral neuropathy 06/17/2015  . Alopecia areata 06/20/2015  . Type 2 diabetes mellitus without complication, without long-term current use of insulin ([place]) 06/20/2015  . Morbid obesity ([place]) 09/28/2015  . MDD (major depressive disorder), recurrent episode, moderate ([place]) 01/02/2016  . Presbyopia 03/07/2016  . Myopia of right eye 03/07/2016  . 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 Regular astigmatism of left eye 03/07/2016  . Grief reaction 07/29/2018  . Hyperlipidemia associated with type 2 diabetes mellitus ([place]) 07/29/2018  . Adenomatous colon polyp 08/13/2018  . Dizziness 09/05/2018  . BPPV (benign paroxysmal positional vertigo), right 09/05/2018  . ETD (Eustachian tube dysfunction), left 09/05/2018  . Diarrhea 09/05/2018  . Left ear pain 09/05/2018  . Tongue irritation 09/05/2018  . Right ear impacted cerumen 09/05/2018  . Essential hypertension 12/30/2019  . Bilateral lower extremity edema 12/30/2019   Resolved Ambulatory Problems    Diagnosis Date Noted  . Obesity, unspecified 03/16/2014  . Obesity (BMI 30-39.9) 04/17/2016   No Additional Past Medical History      Review of Systems See HPI.     Objective:   Physical Exam Vitals reviewed.  Constitutional:      Appearance: Normal appearance. She is obese.  Cardiovascular:     Rate and Rhythm: Normal rate and regular rhythm.     Pulses: Normal pulses.     Heart sounds:  Normal heart sounds.  Pulmonary:     Effort: Pulmonary effort is normal.     Breath sounds: Normal breath sounds.  Musculoskeletal:     Right lower leg: Edema present.     Left lower leg: Edema present.     Comments: 1 +pitting edema legs and feet bilaterally.   Neurological:     General: No focal deficit present.     Mental Status: She is alert and oriented to person, place, and time.  Psychiatric:     Comments: Tearful.        .. Depression screen [facility] 2/9  12/30/2019 11/20/2019 09/05/2018 12/18/2017  Decreased Interest 3 3 0 0  Down, Depressed, Hopeless 2 2 0 0  PHQ - 2 Score 5 5 0 0  Altered sleeping 3 3 0 1  Tired, decreased energy 3 3 1  0  Change in appetite 1 3 0 1  Feeling bad or failure about yourself  2 2 0 0  Trouble concentrating 3 3 0 0  Moving slowly or fidgety/restless 0 2 0 0  Suicidal thoughts 1 0 0 0  PHQ-9 Score 18 21 1 2   Difficult doing work/chores Very difficult Extremely dIfficult Somewhat difficult Not difficult at all   .[name] GAD 7 : Generalized Anxiety Score 12/30/2019 11/20/2019 09/05/2018 12/18/2017  Nervous, Anxious, on Edge 2 3 0 0  Control/stop worrying 2 2 0 0  Worry too much - different things 1 2 0 0  Trouble relaxing 3 3 0 1  Restless 0 0 0 0  Easily annoyed or irritable 3 3 0 1  Afraid - awful might happen 2 1 0 0  Total GAD 7 Score 13 14 0 2  Anxiety Difficulty Very difficult Extremely difficult Not difficult at all Not difficult at all        Assessment & Plan:  Marland KitchenMarland KitchenAntroinette was seen today for annual exam.  Diagnoses and all orders for this visit:  Essential hypertension -     hydrochlorothiazide (HYDRODIURIL) 12.5 MG tablet; Take 1 tablet (12.5 mg total) by mouth daily.  Colon cancer screening -     Ambulatory referral to Gastroenterology  Morbid obesity ([place])  Controlled type 2 diabetes mellitus without complication, without long-term current use of insulin ([place])  MDD (major depressive disorder), recurrent episode, moderate (HCC) -     escitalopram (LEXAPRO) 5 MG tablet; Take 1 tablet (5 mg total) by mouth at bedtime. -     Ambulatory referral to Psychology  Bilateral lower extremity edema -     hydrochlorothiazide (HYDRODIURIL) 12.5 MG tablet; Take 1 tablet (12.5 mg total) by mouth daily.  Anxiety -     escitalopram (LEXAPRO) 5 MG tablet; Take 1 tablet (5 mg total) by mouth at bedtime. -     Ambulatory referral to Psychology   PHQ-9 and GAD-7 scores were elevated but a little better than  last month.  She has not been taking Lexapro regularly.  I asked her to start taking a lower dose 5 mg at bedtime.  We will see if this makes a bigger difference.  Lets give this 4 to 6 weeks to see if it is improving her symptoms.  I will also think that talking with someone could be very beneficial.  Made referral for Juliann Pulse here in the office to start counseling sessions with her.  Strongly encourage some self-care while taking care of her parents.  Blood pressure still elevated today as well as she still has some edema present.  We  will add hydrochlorothiazide 12.5 mg daily to help with blood pressure and edema.  Continue using compression stockings and keep feet elevated.  Continue to watch salt in her diet.  Discussed chronic venous stasis and how this can cause lower extremity edema.  Follow up in 4-6 weeks.

## 2020-01-22 ENCOUNTER — Other Ambulatory Visit: Payer: Self-pay | Admitting: Physician Assistant

## 2020-01-22 DIAGNOSIS — F331 Major depressive disorder, recurrent, moderate: Secondary | ICD-10-CM

## 2020-01-22 DIAGNOSIS — F419 Anxiety disorder, unspecified: Secondary | ICD-10-CM

## 2020-01-22 DIAGNOSIS — R600 Localized edema: Secondary | ICD-10-CM

## 2020-01-22 DIAGNOSIS — I10 Essential (primary) hypertension: Secondary | ICD-10-CM

## 2020-02-11 ENCOUNTER — Encounter: Payer: Self-pay | Admitting: Internal Medicine

## 2020-02-21 ENCOUNTER — Other Ambulatory Visit: Payer: Self-pay | Admitting: Physician Assistant

## 2020-02-21 DIAGNOSIS — F331 Major depressive disorder, recurrent, moderate: Secondary | ICD-10-CM

## 2020-02-21 DIAGNOSIS — F419 Anxiety disorder, unspecified: Secondary | ICD-10-CM

## 2020-02-23 ENCOUNTER — Other Ambulatory Visit: Payer: Self-pay | Admitting: Nurse Practitioner

## 2020-02-23 DIAGNOSIS — E1169 Type 2 diabetes mellitus with other specified complication: Secondary | ICD-10-CM

## 2020-02-26 ENCOUNTER — Encounter: Payer: Self-pay | Admitting: Internal Medicine

## 2020-02-26 ENCOUNTER — Other Ambulatory Visit: Payer: Self-pay

## 2020-02-26 ENCOUNTER — Ambulatory Visit (AMBULATORY_SURGERY_CENTER): Payer: Self-pay

## 2020-02-26 VITALS — Ht 62.0 in | Wt 236.6 lb

## 2020-02-26 DIAGNOSIS — Z86010 Personal history of colonic polyps: Secondary | ICD-10-CM

## 2020-02-26 MED ORDER — SUTAB 1479-225-188 MG PO TABS: 1.0000 | ORAL_TABLET | ORAL | 0 refills | Status: DC

## 2020-02-26 NOTE — Progress Notes (Signed)
No egg or soy allergy known to patient  No issues with past sedation with any surgeries or procedures No intubation problems in the past  No FH of Malignant Hyperthermia No diet pills per patient No home 02 use per patient  No blood thinners per patient  Pt denies issues with constipation  No A fib or A flutter  EMMI video to pt and via [place] 19 guidelines implemented in [place] today with Pt and RN   Coupon given to pt in [facility] today , Code to Pharmacy  COVID vaccines completed on 10/2019 per pt;  Due to the COVID-19 pandemic we are asking patients to follow these guidelines. Please only bring one care partner. Please be aware that your care partner may wait in the car in the parking lot or if they feel like they will be too hot to wait in the car, they may wait in the lobby on the 4th floor. All care partners are required to wear a mask the entire time (we do not have any that we can provide them), they need to practice social distancing, and we will do a Covid check for all patient's and care partners when you arrive. Also we will check their temperature and your temperature. If the care partner waits in their car they need to stay in the parking lot the entire time and we will call them on their cell phone when the patient is ready for discharge so they can bring the car to the front of the building. Also all patient's will need to wear a mask into building.

## 2020-02-29 ENCOUNTER — Ambulatory Visit: Payer: BC Managed Care – PPO | Admitting: Physician Assistant

## 2020-03-29 ENCOUNTER — Telehealth: Payer: Self-pay | Admitting: Internal Medicine

## 2020-03-29 ENCOUNTER — Telehealth: Payer: Self-pay | Admitting: Neurology

## 2020-03-29 NOTE — Telephone Encounter (Signed)
Patient called and wanted authorization for her colonoscopy prep. I encouraged her to discuss with GI office. Will call back with any questions.

## 2020-03-30 NOTE — Telephone Encounter (Signed)
Called and spoke with patient- advised patient to take [employer] coupon sheet to the pharmacy; patient verbalized understanding of information and reports she will call back to the office should further questions or concerns arise;

## 2020-04-05 ENCOUNTER — Encounter: Payer: Self-pay | Admitting: Internal Medicine

## 2020-04-05 ENCOUNTER — Other Ambulatory Visit: Payer: Self-pay

## 2020-04-05 ENCOUNTER — Ambulatory Visit (AMBULATORY_SURGERY_CENTER): Payer: BC Managed Care – PPO | Admitting: Internal Medicine

## 2020-04-05 VITALS — BP 142/85 | HR 63 | Temp 96.6°F | Resp 16 | Ht 62.0 in | Wt 236.6 lb

## 2020-04-05 DIAGNOSIS — D125 Benign neoplasm of sigmoid colon: Secondary | ICD-10-CM

## 2020-04-05 DIAGNOSIS — D120 Benign neoplasm of cecum: Secondary | ICD-10-CM

## 2020-04-05 DIAGNOSIS — D122 Benign neoplasm of ascending colon: Secondary | ICD-10-CM | POA: Diagnosis not present

## 2020-04-05 DIAGNOSIS — Z86010 Personal history of colonic polyps: Secondary | ICD-10-CM

## 2020-04-05 MED ORDER — SODIUM CHLORIDE 0.9 % IV SOLN: 500.0000 mL | Freq: Once | INTRAVENOUS | Status: AC

## 2020-04-05 NOTE — Progress Notes (Signed)
Called to room to assist during endoscopic procedure.  Patient ID and intended procedure confirmed with present staff. Received instructions for my participation in the procedure from the performing physician.  

## 2020-04-05 NOTE — Op Note (Signed)
[place] Patient Name: Christina King Procedure Date: 04/05/2020 11:36 AM MRN: [id number] Endoscopist: Docia Chuck. Henrene Pastor , MD Age: 59 Referring MD:  Date of Birth: 07/30/1960 Gender: Female Account #: [id number] Procedure:                Colonoscopy with cold snare polypectomy x 4 Indications:              High risk colon cancer surveillance: Personal                            history of non-advanced adenoma. Previous                            examination January 2015 (Dr. Shana Chute in [facility]) Medicines:                Monitored Anesthesia Care Procedure:                Pre-Anesthesia Assessment:                           - Prior to the procedure, a History and Physical                            was performed, and patient medications and                            allergies were reviewed. The patient's tolerance of                            previous anesthesia was also reviewed. The risks                            and benefits of the procedure and the sedation                            options and risks were discussed with the patient.                            All questions were answered, and informed consent                            was obtained. Prior Anticoagulants: The patient has                            taken no previous anticoagulant or antiplatelet                            agents. After reviewing the risks and benefits, the                            patient was deemed in satisfactory condition to                            undergo the procedure.  After obtaining informed consent, the colonoscope                            was passed under direct vision. Throughout the                            procedure, the patient's blood pressure, pulse, and                            oxygen saturations were monitored continuously. The                            Colonoscope was introduced through the anus and                             advanced to the the cecum, identified by                            appendiceal orifice and ileocecal valve. The                            ileocecal valve, appendiceal orifice, and rectum                            were photographed. The quality of the bowel                            preparation was excellent. The colonoscopy was                            performed without difficulty. The patient tolerated                            the procedure well. The bowel preparation used was                            SUPREP via split dose instruction. Scope In: 11:49:46 AM Scope Out: 12:02:14 PM Scope Withdrawal Time: 0 hours 10 minutes 35 seconds  Total Procedure Duration: 0 hours 12 minutes 28 seconds  Findings:                 Four polyps were found in the sigmoid colon,                            ascending colon and cecum. The polyps were 1 to 3                            mm in size. These polyps were removed with a cold                            snare. Resection and retrieval were complete.                           Many small and large-mouthed diverticula  were found                            in the entire colon.                           The exam was otherwise without abnormality on                            direct and retroflexion views. Complications:            No immediate complications. Estimated blood loss:                            None. Estimated Blood Loss:     Estimated blood loss: none. Impression:               - Four 1 to 3 mm polyps in the sigmoid colon, in                            the ascending colon and in the cecum, removed with                            a cold snare. Resected and retrieved.                           - Diverticulosis in the entire examined colon.                           - The examination was otherwise normal on direct                            and retroflexion views. Recommendation:           - Repeat colonoscopy in 3 - 5 years for                             surveillance.                           - Patient has a contact number available for                            emergencies. The signs and symptoms of potential                            delayed complications were discussed with the                            patient. Return to normal activities tomorrow.                            Written discharge instructions were provided to the                            patient.                           -  Resume previous diet.                           - Continue present medications.                           - Await pathology results. Docia Chuck. Henrene Pastor, MD 04/05/2020 12:07:40 PM This report has been signed electronically.

## 2020-04-05 NOTE — Progress Notes (Signed)
Pt's states no medical or surgical changes since previsit or office visit. 

## 2020-04-05 NOTE — Patient Instructions (Signed)
YOU HAD AN ENDOSCOPIC PROCEDURE TODAY AT THE [facility]:   Refer to the procedure report that was given to you for any specific questions about what was found during the examination.  If the procedure report does not answer your questions, please call your gastroenterologist to clarify.  If you requested that your care partner not be given the details of your procedure findings, then the procedure report has been included in a sealed envelope for you to review at your convenience later.  YOU SHOULD EXPECT: Some feelings of bloating in the abdomen. Passage of more gas than usual.  Walking can help get rid of the air that was put into your GI tract during the procedure and reduce the bloating. If you had a lower endoscopy (such as a colonoscopy or flexible sigmoidoscopy) you may notice spotting of blood in your stool or on the toilet paper. If you underwent a bowel prep for your procedure, you may not have a normal bowel movement for a few days.  Please Note:  You might notice some irritation and congestion in your nose or some drainage.  This is from the oxygen used during your procedure.  There is no need for concern and it should clear up in a day or so.  SYMPTOMS TO REPORT IMMEDIATELY:   Following lower endoscopy (colonoscopy or flexible sigmoidoscopy):  Excessive amounts of blood in the stool  Significant tenderness or worsening of abdominal pains  Swelling of the abdomen that is new, acute  Fever of 100F or higher   For urgent or emergent issues, a gastroenterologist can be reached at any hour by calling [phone]. Do not use MyChart messaging for urgent concerns.    DIET:  We do recommend a small meal at first, but then you may proceed to your regular diet.  Drink plenty of fluids but you should avoid alcoholic beverages for 24 hours.  MEDICATIONS: Continue present medications.  Please see handouts given to you by your recovery nurse.  ACTIVITY:  You should plan to  take it easy for the rest of today and you should NOT DRIVE or use heavy machinery until tomorrow (because of the sedation medicines used during the test).    FOLLOW UP: Our staff will call the number listed on your records 48-72 hours following your procedure to check on you and address any questions or concerns that you may have regarding the information given to you following your procedure. If we do not reach you, we will leave a message.  We will attempt to reach you two times.  During this call, we will ask if you have developed any symptoms of COVID 19. If you develop any symptoms (ie: fever, flu-like symptoms, shortness of breath, cough etc.) before then, please call [phone].  If you test positive for Covid 19 in the 2 weeks post procedure, please call and report this information to us.    If any biopsies were taken you will be contacted by phone or by letter within the next 1-3 weeks.  Please call us at [phone] if you have not heard about the biopsies in 3 weeks.   Thank you for allowing us to provide for your healthcare needs today.   SIGNATURES/CONFIDENTIALITY: You and/or your care partner have signed paperwork which will be entered into your electronic medical record.  These signatures attest to the fact that that the information above on your After Visit Summary has been reviewed and is understood.  Full responsibility of the   confidentiality of this discharge information lies with you and/or your care-partner. 

## 2020-04-05 NOTE — Progress Notes (Signed)
VS taken by S.H.

## 2020-04-05 NOTE — Progress Notes (Signed)
Lidocaine buffer  robinol antisialogogue 

## 2020-04-05 NOTE — Progress Notes (Signed)
A and O x3. Report to RN. Tolerated MAC anesthesia well.

## 2020-04-07 ENCOUNTER — Telehealth: Payer: Self-pay

## 2020-04-07 NOTE — Telephone Encounter (Signed)
°  Follow up Call-  Call back number 04/05/2020  Post procedure Call Back phone  # [phone]  Permission to leave phone message Yes  Some recent data might be hidden     Patient questions:  Do you have a fever, pain , or abdominal swelling? No. Pain Score  0 *  Have you tolerated food without any problems? Yes.    Have you been able to return to your normal activities? Yes.    Do you have any questions about your discharge instructions: Diet   No. Medications  No. Follow up visit  No.  Do you have questions or concerns about your Care? No.  Actions: * If pain score is 4 or above: 1. No action needed, pain <4.Have you developed a fever since your procedure? no  2.   Have you had an respiratory symptoms (SOB or cough) since your procedure? no  3.   Have you tested positive for COVID 19 since your procedure no  4.   Have you had any family members/close contacts diagnosed with the COVID 19 since your procedure?  no   If yes to any of these questions please route to Joylene John, RN and Joella Prince, RN

## 2020-04-11 ENCOUNTER — Encounter: Payer: Self-pay | Admitting: Internal Medicine

## 2020-05-14 ENCOUNTER — Ambulatory Visit: Payer: Self-pay | Attending: Internal Medicine

## 2020-05-14 DIAGNOSIS — Z23 Encounter for immunization: Secondary | ICD-10-CM

## 2020-05-14 NOTE — Progress Notes (Signed)
   Covid-19 Vaccination Clinic  Name:  Christina King    MRN: [id number] DOB: June 27, 1961  05/14/2020  [name] was observed post Covid-19 immunization for 15 minutes without incident. She was provided with Vaccine Information Sheet and instruction to access the V-Safe system.   [name] was instructed to call 911 with any severe reactions post vaccine: [name] Difficulty breathing  . Swelling of face and throat  . A fast heartbeat  . A bad rash all over body  . Dizziness and weakness

## 2020-05-20 LAB — HM DIABETES EYE EXAM

## 2020-05-30 ENCOUNTER — Other Ambulatory Visit: Payer: Self-pay | Admitting: Nurse Practitioner

## 2020-05-30 DIAGNOSIS — E1169 Type 2 diabetes mellitus with other specified complication: Secondary | ICD-10-CM

## 2020-07-12 ENCOUNTER — Other Ambulatory Visit: Payer: Self-pay

## 2020-07-12 ENCOUNTER — Encounter: Payer: Self-pay | Admitting: Physician Assistant

## 2020-07-12 ENCOUNTER — Ambulatory Visit (INDEPENDENT_AMBULATORY_CARE_PROVIDER_SITE_OTHER): Payer: BC Managed Care – PPO | Admitting: Physician Assistant

## 2020-07-12 VITALS — BP 149/81 | HR 90 | Ht 62.0 in | Wt 227.0 lb

## 2020-07-12 DIAGNOSIS — E785 Hyperlipidemia, unspecified: Secondary | ICD-10-CM

## 2020-07-12 DIAGNOSIS — I10 Essential (primary) hypertension: Secondary | ICD-10-CM

## 2020-07-12 DIAGNOSIS — E1169 Type 2 diabetes mellitus with other specified complication: Secondary | ICD-10-CM | POA: Diagnosis not present

## 2020-07-12 DIAGNOSIS — R21 Rash and other nonspecific skin eruption: Secondary | ICD-10-CM | POA: Diagnosis not present

## 2020-07-12 DIAGNOSIS — E119 Type 2 diabetes mellitus without complications: Secondary | ICD-10-CM

## 2020-07-12 DIAGNOSIS — R600 Localized edema: Secondary | ICD-10-CM

## 2020-07-12 LAB — POCT GLYCOSYLATED HEMOGLOBIN (HGB A1C): Hemoglobin A1C: 6.8 % — AB (ref 4.0–5.6)

## 2020-07-12 MED ORDER — CLOTRIMAZOLE-BETAMETHASONE 1-0.05 % EX CREA: 1.0000 "application " | TOPICAL_CREAM | Freq: Two times a day (BID) | CUTANEOUS | 0 refills | Status: AC

## 2020-07-12 MED ORDER — SYNJARDY XR 10-1000 MG PO TB24: 1.0000 | ORAL_TABLET | Freq: Every day | ORAL | 1 refills | Status: AC

## 2020-07-12 MED ORDER — HYDROCHLOROTHIAZIDE 12.5 MG PO TABS: 12.5000 mg | ORAL_TABLET | Freq: Every day | ORAL | 1 refills | Status: DC

## 2020-07-12 NOTE — Patient Instructions (Signed)
Contact Dermatitis °Dermatitis is redness, soreness, and swelling (inflammation) of the skin. Contact dermatitis is a reaction to something that touches the skin. °There are two types of contact dermatitis: °· Irritant contact dermatitis. This happens when something bothers (irritates) your skin, like soap. °· Allergic contact dermatitis. This is caused when you are exposed to something that you are allergic to, such as poison ivy. °What are the causes? °· Common causes of irritant contact dermatitis include: °? Makeup. °? Soaps. °? Detergents. °? Bleaches. °? Acids. °? Metals, such as nickel. °· Common causes of allergic contact dermatitis include: °? Plants. °? Chemicals. °? Jewelry. °? Latex. °? Medicines. °? Preservatives in products, such as clothing. °What increases the risk? °· Having a job that exposes you to things that bother your skin. °· Having asthma or eczema. °What are the signs or symptoms? °Symptoms may happen anywhere the irritant has touched your skin. Symptoms include: °· Dry or flaky skin. °· Redness. °· Cracks. °· Itching. °· Pain or a burning feeling. °· Blisters. °· Blood or clear fluid draining from skin cracks. °With allergic contact dermatitis, swelling may occur. This may happen in places such as the eyelids, mouth, or genitals. °How is this treated? °· This condition is treated by checking for the cause of the reaction and protecting your skin. Treatment may also include: °? Steroid creams, ointments, or medicines. °? Antibiotic medicines or other ointments, if you have a skin infection. °? Lotion or medicines to help with itching. °? A bandage (dressing). °Follow these instructions at home: °Skin care °· Moisturize your skin as needed. °· Put cool cloths on your skin. °· Put a baking soda paste on your skin. Stir water into baking soda until it looks like a paste. °· Do not scratch your skin. °· Avoid having things rub up against your skin. °· Avoid the use of soaps, perfumes, and  dyes. °Medicines °· Take or apply over-the-counter and prescription medicines only as told by your doctor. °· If you were prescribed an antibiotic medicine, take or apply it as told by your doctor. Do not stop using it even if your condition starts to get better. °Bathing °· Take a bath with: °? Epsom salts. °? Baking soda. °? Colloidal oatmeal. °· Bathe less often. °· Bathe in warm water. Avoid using hot water. °Bandage care °· If you were given a bandage, change it as told by your health care provider. °· Wash your hands with soap and water before and after you change your bandage. If soap and water are not available, use hand sanitizer. °General instructions °· Avoid the things that caused your reaction. If you do not know what caused it, keep a journal. Write down: °? What you eat. °? What skin products you use. °? What you drink. °? What you wear in the area that has symptoms. This includes jewelry. °· Check the affected areas every day for signs of infection. Check for: °? More redness, swelling, or pain. °? More fluid or blood. °? Warmth. °? Pus or a bad smell. °· Keep all follow-up visits as told by your doctor. This is important. °Contact a doctor if: °· You do not get better with treatment. °· Your condition gets worse. °· You have signs of infection, such as: °? More swelling. °? Tenderness. °? More redness. °? Soreness. °? Warmth. °· You have a fever. °· You have new symptoms. °Get help right away if: °· You have a very bad headache. °· You have neck pain. °·   Your neck is stiff. °· You throw up (vomit). °· You feel very sleepy. °· You see red streaks coming from the area. °· Your bone or joint near the area hurts after the skin has healed. °· The area turns darker. °· You have trouble breathing. °Summary °· Dermatitis is redness, soreness, and swelling of the skin. °· Symptoms may occur where the irritant has touched you. °· Treatment may include medicines and skin care. °· If you do not know what caused  your reaction, keep a journal. °· Contact a doctor if your condition gets worse or you have signs of infection. °This information is not intended to replace advice given to you by your health care provider. Make sure you discuss any questions you have with your health care provider. °Document Revised: 10/22/2018 Document Reviewed: 01/15/2018 °Elsevier Patient Education © 2020 [employer] ° °

## 2020-07-12 NOTE — Progress Notes (Signed)
Subjective:    Patient ID: Christina King, female    DOB: 07/30/1960, 59 y.o.   MRN: [id number]  HPI  Patient is a 59 year old female with type 2 diabetes, hypertension, hyperlipidemia who presents to the clinic with a rash around her neck and on her right temple. She noticed the rash on Christmas eve approximately, 4 days ago. It is itchy. She denies any changes in medication. She denies any outside exposures. She denies any new lotions or creams. She did put some OTC hydrocortisone cream which seemed to help the itch.   Patient did not take her blood pressure medications this morning. She denies any chest pain, palpitations, headache.  She is taking her Synjardy. She is not checking her sugars. She denies any hypoglycemia. She denies any open sores or wounds. She is not exercising or watching her diet.  .. Active Ambulatory Problems    Diagnosis Date Noted  . Hyperlipidemia 02/24/2014  . Dependence on nicotine from other tobacco product 02/24/2014  . Tobacco abuse 02/24/2014  . Personal history of colonic polyps 02/24/2014  . Adjustment disorder with anxiety 03/16/2014  . Vitreous floaters of both eyes 06/15/2015  . Vitamin D deficiency 06/16/2015  . Controlled type 2 diabetes mellitus without complication, without long-term current use of insulin (HCC) 06/17/2015  . Idiopathic peripheral neuropathy 06/17/2015  . Alopecia areata 06/20/2015  . Type 2 diabetes mellitus without complication, without long-term current use of insulin (HCC) 06/20/2015  . Morbid obesity (HCC) 09/28/2015  . MDD (major depressive disorder), recurrent episode, moderate (HCC) 01/02/2016  . Presbyopia 03/07/2016  . Myopia of right eye 03/07/2016  . 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 Regular astigmatism of left eye 03/07/2016  . Grief reaction 07/29/2018  . Hyperlipidemia associated with type 2 diabetes mellitus (HCC) 07/29/2018  . Adenomatous colon polyp 08/13/2018  . Dizziness  09/05/2018  . BPPV (benign paroxysmal positional vertigo), right 09/05/2018  . ETD (Eustachian tube dysfunction), left 09/05/2018  . Diarrhea 09/05/2018  . Left ear pain 09/05/2018  . Tongue irritation 09/05/2018  . Right ear impacted cerumen 09/05/2018  . Essential hypertension 12/30/2019  . Bilateral lower extremity edema 12/30/2019   Resolved Ambulatory Problems    Diagnosis Date Noted  . Obesity, unspecified 03/16/2014  . Obesity (BMI 30-39.9) 04/17/2016   Past Medical History:  Diagnosis Date  . Anemia   . Anxiety   . Cataract   . Depression   . Diabetes mellitus without complication (HCC)   . Hypertension      Review of Systems  All other systems reviewed and are negative.      Objective:   Physical Exam Vitals reviewed.  Constitutional:      Appearance: Normal appearance. She is obese.  Cardiovascular:     Rate and Rhythm: Normal rate and regular rhythm.     Pulses: Normal pulses.  Pulmonary:     Effort: Pulmonary effort is normal.     Breath sounds: Normal breath sounds.  Musculoskeletal:     Right lower leg: No edema.     Left lower leg: No edema.  Skin:    Findings: Rash present.     Comments: Maculopapular rash around neck and right temple.   Neurological:     Mental Status: She is alert.  Psychiatric:        Mood and Affect: Mood normal.        Behavior: Behavior normal.           Assessment & Plan:  Marland KitchenMarland Kitchen  Christina King was seen today for rash.  Diagnoses and all orders for this visit:  Rash and nonspecific skin eruption -     clotrimazole-betamethasone (LOTRISONE) cream; Apply 1 application topically 2 (two) times daily. -     COMPLETE METABOLIC PANEL WITH GFR -     CBC with Differential/Platelet  Essential hypertension -     hydrochlorothiazide (HYDRODIURIL) 12.5 MG tablet; Take 1 tablet (12.5 mg total) by mouth daily. -     COMPLETE METABOLIC PANEL WITH GFR  Bilateral lower extremity edema -     hydrochlorothiazide (HYDRODIURIL) 12.5  MG tablet; Take 1 tablet (12.5 mg total) by mouth daily. -     CBC with Differential/Platelet  Controlled type 2 diabetes mellitus with other specified complication, without long-term current use of insulin (HCC) -     Empagliflozin-metFORMIN HCl ER (SYNJARDY XR) 04-999 MG TB24; Take 1 tablet by mouth daily. -     COMPLETE METABOLIC PANEL WITH GFR -     POCT HgB A1C  Hyperlipidemia LDL goal <70 -     Lipid Panel w/reflex Direct LDL  Controlled type 2 diabetes mellitus without complication, without long-term current use of insulin (HCC)  rash appears like a contact dermatitis. Unclear what caused it. No new medications or known exposure. Some areas appear a little moist and yeast like. Sent lotrisone for next 2 weeks. Follow up if worsening or not improving. Keep area dry and avoid any scented products.   Out of window for A1C check. Had done today while in office.   Lab Results  Component Value Date   HGBA1C 6.8 (A) 07/12/2020   A1C to goal.  Stay on same medications.  BP not to goal. She stated she did not take medications this morning. Not on ACE. Discussed recommendation with patient. She will consider.  On statin. Ordered lipid panel.  Eye exam UTD need to get copy with [facility].  UTD foot exam.  Declines flu shot. Pneumonia and covid UTD.   Follow up in 3 months.

## 2020-07-18 ENCOUNTER — Telehealth: Payer: Self-pay | Admitting: Physician Assistant

## 2020-07-18 ENCOUNTER — Encounter: Payer: Self-pay | Admitting: Physician Assistant

## 2020-07-18 NOTE — Telephone Encounter (Signed)
Can we get eye exam from [facility]?

## 2020-07-26 NOTE — Telephone Encounter (Signed)
Records release sent confirmation received.

## 2020-08-01 ENCOUNTER — Encounter: Payer: Self-pay | Admitting: Physician Assistant

## 2020-10-10 ENCOUNTER — Ambulatory Visit: Payer: BC Managed Care – PPO | Admitting: Physician Assistant

## 2021-01-14 ENCOUNTER — Other Ambulatory Visit: Payer: Self-pay | Admitting: Physician Assistant

## 2021-01-14 DIAGNOSIS — R600 Localized edema: Secondary | ICD-10-CM

## 2021-01-14 DIAGNOSIS — I10 Essential (primary) hypertension: Secondary | ICD-10-CM

## 2021-02-18 ENCOUNTER — Other Ambulatory Visit: Payer: Self-pay | Admitting: Physician Assistant

## 2021-02-18 DIAGNOSIS — I10 Essential (primary) hypertension: Secondary | ICD-10-CM

## 2021-02-18 DIAGNOSIS — R600 Localized edema: Secondary | ICD-10-CM

## 2021-03-02 ENCOUNTER — Other Ambulatory Visit: Payer: Self-pay | Admitting: Physician Assistant

## 2021-03-02 DIAGNOSIS — I10 Essential (primary) hypertension: Secondary | ICD-10-CM

## 2021-03-02 DIAGNOSIS — R600 Localized edema: Secondary | ICD-10-CM

## 2021-03-10 ENCOUNTER — Other Ambulatory Visit: Payer: Self-pay | Admitting: Physician Assistant

## 2021-03-10 DIAGNOSIS — R600 Localized edema: Secondary | ICD-10-CM

## 2021-03-10 DIAGNOSIS — I10 Essential (primary) hypertension: Secondary | ICD-10-CM

## 2021-03-10 NOTE — Telephone Encounter (Signed)
Left a message on the home phone number for patient to call us back to get a follow up appointment scheduled for any further refills. AM

## 2021-03-10 NOTE — Telephone Encounter (Signed)
HCTZ written for 1 week.  Patient needs an appt. Please schedule.

## 2021-04-06 ENCOUNTER — Ambulatory Visit: Payer: BC Managed Care – PPO | Attending: Internal Medicine

## 2021-04-06 DIAGNOSIS — Z23 Encounter for immunization: Secondary | ICD-10-CM

## 2021-04-06 NOTE — Progress Notes (Signed)
   Covid-19 Vaccination Clinic  Name:  Christina King    MRN: [id number] DOB: [birth year]  04/06/2021  Christina King was observed post Covid-19 immunization for 15 minutes without incident. She was provided with Vaccine Information Sheet and instruction to access the V-Safe system.   Christina King was instructed to call 911 with any severe reactions post vaccine: Difficulty breathing  Swelling of face and throat  A fast heartbeat  A bad rash all over body  Dizziness and weakness

## 2021-04-12 ENCOUNTER — Other Ambulatory Visit (HOSPITAL_BASED_OUTPATIENT_CLINIC_OR_DEPARTMENT_OTHER): Payer: Self-pay

## 2021-04-12 MED ORDER — COVID-19MRNA BIVAL VACC PFIZER 30 MCG/0.3ML IM SUSP
INTRAMUSCULAR | 0 refills | Status: AC
  Filled 2021-04-12: qty 0.3, 1d supply, fill #0

## 2021-04-13 ENCOUNTER — Other Ambulatory Visit: Payer: Self-pay | Admitting: Physician Assistant

## 2021-04-13 DIAGNOSIS — I10 Essential (primary) hypertension: Secondary | ICD-10-CM

## 2021-04-13 DIAGNOSIS — R600 Localized edema: Secondary | ICD-10-CM

## 2021-05-23 LAB — HM DIABETES EYE EXAM

## 2021-05-26 ENCOUNTER — Encounter: Payer: Self-pay | Admitting: Physician Assistant

## 2023-01-24 ENCOUNTER — Encounter: Payer: Self-pay | Admitting: Internal Medicine
